# Patient Record
Sex: Female | Born: 1965 | Race: Black or African American | Hispanic: No | Smoking: Former smoker
Health system: Southern US, Community
[De-identification: ages and names within clinical notes are randomized; demographics above are authoritative.]

## PROBLEM LIST (undated history)

## (undated) DIAGNOSIS — C801 Malignant (primary) neoplasm, unspecified: Secondary | ICD-10-CM

## (undated) DIAGNOSIS — J45909 Unspecified asthma, uncomplicated: Secondary | ICD-10-CM

## (undated) DIAGNOSIS — I10 Essential (primary) hypertension: Secondary | ICD-10-CM

## (undated) DIAGNOSIS — J449 Chronic obstructive pulmonary disease, unspecified: Secondary | ICD-10-CM

## (undated) HISTORY — PX: KIDNEY SURGERY: SHX687

## (undated) HISTORY — PX: CHOLECYSTECTOMY: SHX55

## (undated) HISTORY — PX: ABDOMINAL HYSTERECTOMY: SHX81

---

## 1998-07-15 ENCOUNTER — Emergency Department (HOSPITAL_COMMUNITY): Admission: EM | Admit: 1998-07-15 | Discharge: 1998-07-15 | Payer: Self-pay | Admitting: Emergency Medicine

## 1998-10-07 ENCOUNTER — Encounter: Payer: Self-pay | Admitting: Obstetrics and Gynecology

## 1998-10-08 ENCOUNTER — Ambulatory Visit (HOSPITAL_COMMUNITY): Admission: RE | Admit: 1998-10-08 | Discharge: 1998-10-08 | Payer: Self-pay | Admitting: Obstetrics and Gynecology

## 1998-12-02 ENCOUNTER — Inpatient Hospital Stay (HOSPITAL_COMMUNITY): Admission: RE | Admit: 1998-12-02 | Discharge: 1998-12-05 | Payer: Self-pay | Admitting: Obstetrics and Gynecology

## 1999-11-22 ENCOUNTER — Encounter: Payer: Self-pay | Admitting: Emergency Medicine

## 1999-11-22 ENCOUNTER — Emergency Department (HOSPITAL_COMMUNITY): Admission: EM | Admit: 1999-11-22 | Discharge: 1999-11-22 | Payer: Self-pay | Admitting: Emergency Medicine

## 1999-11-26 ENCOUNTER — Ambulatory Visit (HOSPITAL_COMMUNITY): Admission: RE | Admit: 1999-11-26 | Discharge: 1999-11-26 | Payer: Self-pay | Admitting: *Deleted

## 2011-06-21 DIAGNOSIS — G43909 Migraine, unspecified, not intractable, without status migrainosus: Secondary | ICD-10-CM | POA: Insufficient documentation

## 2011-06-21 DIAGNOSIS — K219 Gastro-esophageal reflux disease without esophagitis: Secondary | ICD-10-CM | POA: Insufficient documentation

## 2011-06-21 DIAGNOSIS — F32A Depression, unspecified: Secondary | ICD-10-CM | POA: Insufficient documentation

## 2013-03-07 DIAGNOSIS — I10 Essential (primary) hypertension: Secondary | ICD-10-CM | POA: Insufficient documentation

## 2014-12-11 DIAGNOSIS — M25562 Pain in left knee: Secondary | ICD-10-CM | POA: Insufficient documentation

## 2016-06-02 DIAGNOSIS — Z79891 Long term (current) use of opiate analgesic: Secondary | ICD-10-CM | POA: Insufficient documentation

## 2016-06-02 DIAGNOSIS — M542 Cervicalgia: Secondary | ICD-10-CM | POA: Insufficient documentation

## 2016-07-15 DIAGNOSIS — R3129 Other microscopic hematuria: Secondary | ICD-10-CM | POA: Insufficient documentation

## 2017-02-03 DIAGNOSIS — Z905 Acquired absence of kidney: Secondary | ICD-10-CM | POA: Insufficient documentation

## 2017-03-06 DIAGNOSIS — R55 Syncope and collapse: Secondary | ICD-10-CM | POA: Insufficient documentation

## 2017-03-25 DIAGNOSIS — M1710 Unilateral primary osteoarthritis, unspecified knee: Secondary | ICD-10-CM | POA: Insufficient documentation

## 2017-03-25 DIAGNOSIS — M222X1 Patellofemoral disorders, right knee: Secondary | ICD-10-CM | POA: Insufficient documentation

## 2017-03-25 DIAGNOSIS — M222X2 Patellofemoral disorders, left knee: Secondary | ICD-10-CM | POA: Insufficient documentation

## 2017-04-29 ENCOUNTER — Emergency Department (HOSPITAL_COMMUNITY): Payer: Medicaid Other

## 2017-04-29 ENCOUNTER — Emergency Department (HOSPITAL_COMMUNITY)
Admission: EM | Admit: 2017-04-29 | Discharge: 2017-04-29 | Disposition: A | Discharge status: Home / Self Care | Payer: Medicaid Other | Attending: Emergency Medicine | Admitting: Emergency Medicine

## 2017-04-29 ENCOUNTER — Encounter (HOSPITAL_COMMUNITY): Payer: Self-pay | Admitting: Emergency Medicine

## 2017-04-29 DIAGNOSIS — F17210 Nicotine dependence, cigarettes, uncomplicated: Secondary | ICD-10-CM | POA: Insufficient documentation

## 2017-04-29 DIAGNOSIS — M545 Low back pain, unspecified: Secondary | ICD-10-CM

## 2017-04-29 DIAGNOSIS — Y9389 Activity, other specified: Secondary | ICD-10-CM | POA: Insufficient documentation

## 2017-04-29 DIAGNOSIS — J45909 Unspecified asthma, uncomplicated: Secondary | ICD-10-CM | POA: Insufficient documentation

## 2017-04-29 DIAGNOSIS — R51 Headache: Secondary | ICD-10-CM | POA: Insufficient documentation

## 2017-04-29 DIAGNOSIS — M542 Cervicalgia: Secondary | ICD-10-CM | POA: Diagnosis present

## 2017-04-29 DIAGNOSIS — Y92410 Unspecified street and highway as the place of occurrence of the external cause: Secondary | ICD-10-CM | POA: Insufficient documentation

## 2017-04-29 DIAGNOSIS — Y998 Other external cause status: Secondary | ICD-10-CM | POA: Insufficient documentation

## 2017-04-29 HISTORY — DX: Malignant (primary) neoplasm, unspecified: C80.1

## 2017-04-29 HISTORY — DX: Unspecified asthma, uncomplicated: J45.909

## 2017-04-29 MED ORDER — HYDROCODONE-ACETAMINOPHEN 5-325 MG PO TABS: ORAL_TABLET | ORAL | 0 refills | Status: DC

## 2017-04-29 MED ORDER — METHOCARBAMOL 500 MG PO TABS: 1000.0000 mg | ORAL_TABLET | Freq: Four times a day (QID) | ORAL | 0 refills | Status: DC | PRN

## 2017-04-29 NOTE — Discharge Instructions (Signed)
Take the prescriptions as directed.  Apply moist heat or ice to the area(s) of discomfort, for 15 minutes at a time, several times per day for the next few days.  Do not fall asleep on a heating or ice pack.  Call your regular medical doctor on Monday to schedule a follow up appointment next week.  Return to the Emergency Department immediately if worsening. ° °
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
## 2017-09-09 ENCOUNTER — Inpatient Hospital Stay
Admission: EM | Admit: 2017-09-09 | Discharge: 2017-09-12 | DRG: 202 | Disposition: A | Discharge status: Home / Self Care | Payer: Medicaid Other | Attending: Internal Medicine | Admitting: Internal Medicine

## 2017-09-09 DIAGNOSIS — J4541 Moderate persistent asthma with (acute) exacerbation: Principal | ICD-10-CM | POA: Diagnosis present

## 2017-09-09 DIAGNOSIS — E876 Hypokalemia: Secondary | ICD-10-CM | POA: Diagnosis present

## 2017-09-09 DIAGNOSIS — F17210 Nicotine dependence, cigarettes, uncomplicated: Secondary | ICD-10-CM | POA: Diagnosis present

## 2017-09-09 DIAGNOSIS — R0602 Shortness of breath: Secondary | ICD-10-CM

## 2017-09-09 DIAGNOSIS — I10 Essential (primary) hypertension: Secondary | ICD-10-CM | POA: Diagnosis present

## 2017-09-09 DIAGNOSIS — J4521 Mild intermittent asthma with (acute) exacerbation: Secondary | ICD-10-CM

## 2017-09-09 DIAGNOSIS — K219 Gastro-esophageal reflux disease without esophagitis: Secondary | ICD-10-CM | POA: Diagnosis present

## 2017-09-09 DIAGNOSIS — J9601 Acute respiratory failure with hypoxia: Secondary | ICD-10-CM | POA: Diagnosis present

## 2017-09-09 DIAGNOSIS — J45901 Unspecified asthma with (acute) exacerbation: Secondary | ICD-10-CM | POA: Diagnosis present

## 2017-09-09 DIAGNOSIS — Z8249 Family history of ischemic heart disease and other diseases of the circulatory system: Secondary | ICD-10-CM

## 2017-09-09 DIAGNOSIS — Z6841 Body Mass Index (BMI) 40.0 and over, adult: Secondary | ICD-10-CM

## 2017-09-09 DIAGNOSIS — J440 Chronic obstructive pulmonary disease with acute lower respiratory infection: Secondary | ICD-10-CM | POA: Diagnosis present

## 2017-09-09 DIAGNOSIS — J441 Chronic obstructive pulmonary disease with (acute) exacerbation: Secondary | ICD-10-CM | POA: Diagnosis present

## 2017-09-09 DIAGNOSIS — R0902 Hypoxemia: Secondary | ICD-10-CM

## 2017-09-09 DIAGNOSIS — J209 Acute bronchitis, unspecified: Secondary | ICD-10-CM | POA: Diagnosis present

## 2017-09-09 HISTORY — DX: Chronic obstructive pulmonary disease, unspecified: J44.9

## 2017-09-10 ENCOUNTER — Emergency Department: Payer: Medicaid Other

## 2017-09-10 ENCOUNTER — Encounter: Payer: Self-pay | Admitting: Emergency Medicine

## 2017-09-10 ENCOUNTER — Other Ambulatory Visit: Payer: Self-pay

## 2017-09-10 DIAGNOSIS — J440 Chronic obstructive pulmonary disease with acute lower respiratory infection: Secondary | ICD-10-CM | POA: Diagnosis present

## 2017-09-10 DIAGNOSIS — J4541 Moderate persistent asthma with (acute) exacerbation: Secondary | ICD-10-CM | POA: Diagnosis present

## 2017-09-10 DIAGNOSIS — J441 Chronic obstructive pulmonary disease with (acute) exacerbation: Secondary | ICD-10-CM | POA: Diagnosis present

## 2017-09-10 DIAGNOSIS — J9601 Acute respiratory failure with hypoxia: Secondary | ICD-10-CM | POA: Diagnosis present

## 2017-09-10 DIAGNOSIS — E876 Hypokalemia: Secondary | ICD-10-CM | POA: Diagnosis present

## 2017-09-10 DIAGNOSIS — F17210 Nicotine dependence, cigarettes, uncomplicated: Secondary | ICD-10-CM | POA: Diagnosis present

## 2017-09-10 DIAGNOSIS — J45901 Unspecified asthma with (acute) exacerbation: Secondary | ICD-10-CM | POA: Diagnosis present

## 2017-09-10 DIAGNOSIS — Z6841 Body Mass Index (BMI) 40.0 and over, adult: Secondary | ICD-10-CM | POA: Diagnosis not present

## 2017-09-10 DIAGNOSIS — I10 Essential (primary) hypertension: Secondary | ICD-10-CM | POA: Diagnosis present

## 2017-09-10 DIAGNOSIS — Z8249 Family history of ischemic heart disease and other diseases of the circulatory system: Secondary | ICD-10-CM | POA: Diagnosis not present

## 2017-09-10 DIAGNOSIS — K219 Gastro-esophageal reflux disease without esophagitis: Secondary | ICD-10-CM | POA: Diagnosis present

## 2017-09-10 DIAGNOSIS — R0602 Shortness of breath: Secondary | ICD-10-CM | POA: Diagnosis present

## 2017-09-10 DIAGNOSIS — J209 Acute bronchitis, unspecified: Secondary | ICD-10-CM | POA: Diagnosis present

## 2017-09-10 LAB — CBC
HEMATOCRIT: 34 % — AB (ref 35.0–47.0)
HEMATOCRIT: 35.1 % (ref 35.0–47.0)
HEMOGLOBIN: 11.5 g/dL — AB (ref 12.0–16.0)
Hemoglobin: 11.4 g/dL — ABNORMAL LOW (ref 12.0–16.0)
MCH: 31.1 pg (ref 26.0–34.0)
MCH: 31.5 pg (ref 26.0–34.0)
MCHC: 32.8 g/dL (ref 32.0–36.0)
MCHC: 33.5 g/dL (ref 32.0–36.0)
MCV: 93.9 fL (ref 80.0–100.0)
MCV: 94.7 fL (ref 80.0–100.0)
PLATELETS: 211 10*3/uL (ref 150–440)
Platelets: 219 10*3/uL (ref 150–440)
RBC: 3.62 MIL/uL — AB (ref 3.80–5.20)
RBC: 3.71 MIL/uL — AB (ref 3.80–5.20)
RDW: 13.7 % (ref 11.5–14.5)
RDW: 13.8 % (ref 11.5–14.5)
WBC: 7.8 10*3/uL (ref 3.6–11.0)
WBC: 8.7 10*3/uL (ref 3.6–11.0)

## 2017-09-10 LAB — COMPREHENSIVE METABOLIC PANEL WITH GFR
ALT: 12 U/L — ABNORMAL LOW (ref 14–54)
AST: 22 U/L (ref 15–41)
Albumin: 3.8 g/dL (ref 3.5–5.0)
Alkaline Phosphatase: 88 U/L (ref 38–126)
Anion gap: 7 (ref 5–15)
BUN: 18 mg/dL (ref 6–20)
CO2: 26 mmol/L (ref 22–32)
Calcium: 9.2 mg/dL (ref 8.9–10.3)
Chloride: 107 mmol/L (ref 101–111)
Creatinine, Ser: 1.13 mg/dL — ABNORMAL HIGH (ref 0.44–1.00)
GFR calc Af Amer: >60 mL/min
GFR calc non Af Amer: 55 mL/min — ABNORMAL LOW
Glucose, Bld: 112 mg/dL — ABNORMAL HIGH (ref 65–99)
Potassium: 3.4 mmol/L — ABNORMAL LOW (ref 3.5–5.1)
Sodium: 140 mmol/L (ref 135–145)
Total Bilirubin: 0.5 mg/dL (ref 0.3–1.2)
Total Protein: 7.2 g/dL (ref 6.5–8.1)

## 2017-09-10 LAB — BASIC METABOLIC PANEL
ANION GAP: 11 (ref 5–15)
BUN: 19 mg/dL (ref 6–20)
CO2: 21 mmol/L — ABNORMAL LOW (ref 22–32)
Calcium: 9.2 mg/dL (ref 8.9–10.3)
Chloride: 108 mmol/L (ref 101–111)
Creatinine, Ser: 1.13 mg/dL — ABNORMAL HIGH (ref 0.44–1.00)
GFR, EST NON AFRICAN AMERICAN: 55 mL/min — AB (ref >60)
Glucose, Bld: 183 mg/dL — ABNORMAL HIGH (ref 65–99)
POTASSIUM: 3 mmol/L — AB (ref 3.5–5.1)
SODIUM: 140 mmol/L (ref 135–145)

## 2017-09-10 LAB — TROPONIN I: Troponin I: <0.03 ng/mL

## 2017-09-10 MED ORDER — IPRATROPIUM-ALBUTEROL 0.5-2.5 (3) MG/3ML IN SOLN: 3.0000 mL | Freq: Four times a day (QID) | RESPIRATORY_TRACT | Status: DC

## 2017-09-10 MED ORDER — PANTOPRAZOLE SODIUM 40 MG PO TBEC
40.0000 mg | DELAYED_RELEASE_TABLET | Freq: Every day | ORAL | Status: DC
  Administered 2017-09-10 – 2017-09-12 (×3): 40 mg via ORAL
  Filled 2017-09-10 (×3): qty 1

## 2017-09-10 MED ORDER — ONDANSETRON HCL 4 MG/2ML IJ SOLN: 4.0000 mg | Freq: Four times a day (QID) | INTRAMUSCULAR | Status: DC | PRN

## 2017-09-10 MED ORDER — SODIUM CHLORIDE 0.9% FLUSH: 3.0000 mL | INTRAVENOUS | Status: DC | PRN

## 2017-09-10 MED ORDER — HYDROCOD POLST-CPM POLST ER 10-8 MG/5ML PO SUER
5.0000 mL | Freq: Once | ORAL | Status: AC
  Administered 2017-09-10: 5 mL via ORAL
  Filled 2017-09-10: qty 5

## 2017-09-10 MED ORDER — ONDANSETRON HCL 4 MG PO TABS: 4.0000 mg | ORAL_TABLET | Freq: Four times a day (QID) | ORAL | Status: DC | PRN

## 2017-09-10 MED ORDER — LORATADINE 10 MG PO TABS
10.0000 mg | ORAL_TABLET | Freq: Every day | ORAL | Status: DC
Start: 2017-09-10 — End: 2017-09-12
  Administered 2017-09-10 – 2017-09-12 (×3): 10 mg via ORAL
  Filled 2017-09-10 (×3): qty 1

## 2017-09-10 MED ORDER — SENNOSIDES-DOCUSATE SODIUM 8.6-50 MG PO TABS: 1.0000 | ORAL_TABLET | Freq: Every evening | ORAL | Status: DC | PRN

## 2017-09-10 MED ORDER — HYDROCODONE-ACETAMINOPHEN 5-325 MG PO TABS
1.0000 | ORAL_TABLET | Freq: Four times a day (QID) | ORAL | Status: DC | PRN
  Administered 2017-09-10 – 2017-09-11 (×4): 1 via ORAL
  Filled 2017-09-10 (×4): qty 1

## 2017-09-10 MED ORDER — ACETAMINOPHEN 650 MG RE SUPP: 650.0000 mg | Freq: Four times a day (QID) | RECTAL | Status: DC | PRN

## 2017-09-10 MED ORDER — HYDROCHLOROTHIAZIDE 25 MG PO TABS
25.0000 mg | ORAL_TABLET | Freq: Every day | ORAL | Status: DC
  Filled 2017-09-10 (×2): qty 1

## 2017-09-10 MED ORDER — IPRATROPIUM-ALBUTEROL 0.5-2.5 (3) MG/3ML IN SOLN
3.0000 mL | Freq: Four times a day (QID) | RESPIRATORY_TRACT | Status: DC
  Administered 2017-09-10 – 2017-09-11 (×7): 3 mL via RESPIRATORY_TRACT
  Filled 2017-09-10 (×7): qty 3

## 2017-09-10 MED ORDER — FAMOTIDINE 20 MG PO TABS
20.0000 mg | ORAL_TABLET | Freq: Two times a day (BID) | ORAL | Status: DC
  Administered 2017-09-10 – 2017-09-12 (×5): 20 mg via ORAL
  Filled 2017-09-10 (×5): qty 1

## 2017-09-10 MED ORDER — ALBUTEROL SULFATE (2.5 MG/3ML) 0.083% IN NEBU
2.5000 mg | INHALATION_SOLUTION | RESPIRATORY_TRACT | Status: DC | PRN
  Administered 2017-09-10: 2.5 mg via RESPIRATORY_TRACT
  Filled 2017-09-10: qty 3

## 2017-09-10 MED ORDER — METHYLPREDNISOLONE SODIUM SUCC 125 MG IJ SOLR
125.0000 mg | Freq: Four times a day (QID) | INTRAMUSCULAR | Status: DC
  Administered 2017-09-10: 125 mg via INTRAVENOUS
  Filled 2017-09-10: qty 2

## 2017-09-10 MED ORDER — IPRATROPIUM-ALBUTEROL 0.5-2.5 (3) MG/3ML IN SOLN
3.0000 mL | Freq: Once | RESPIRATORY_TRACT | Status: AC
  Administered 2017-09-10: 3 mL via RESPIRATORY_TRACT
  Filled 2017-09-10: qty 3

## 2017-09-10 MED ORDER — ACETAMINOPHEN 325 MG PO TABS: 650.0000 mg | ORAL_TABLET | Freq: Four times a day (QID) | ORAL | Status: DC | PRN

## 2017-09-10 MED ORDER — ENOXAPARIN SODIUM 40 MG/0.4ML ~~LOC~~ SOLN
40.0000 mg | SUBCUTANEOUS | Status: DC
  Administered 2017-09-10 – 2017-09-12 (×3): 40 mg via SUBCUTANEOUS
  Filled 2017-09-10 (×3): qty 0.4

## 2017-09-10 MED ORDER — METHYLPREDNISOLONE SODIUM SUCC 125 MG IJ SOLR: 125.0000 mg | Freq: Four times a day (QID) | INTRAMUSCULAR | Status: DC

## 2017-09-10 MED ORDER — SODIUM CHLORIDE 0.9% FLUSH
3.0000 mL | Freq: Two times a day (BID) | INTRAVENOUS | Status: DC
  Administered 2017-09-10 – 2017-09-12 (×6): 3 mL via INTRAVENOUS

## 2017-09-10 MED ORDER — AMLODIPINE BESYLATE 5 MG PO TABS
10.0000 mg | ORAL_TABLET | Freq: Every day | ORAL | Status: DC
  Administered 2017-09-10 – 2017-09-12 (×3): 10 mg via ORAL
  Filled 2017-09-10 (×3): qty 2

## 2017-09-10 MED ORDER — SODIUM CHLORIDE 0.9 % IV SOLN: 250.0000 mL | INTRAVENOUS | Status: DC | PRN

## 2017-09-10 MED ORDER — METHYLPREDNISOLONE SODIUM SUCC 40 MG IJ SOLR
40.0000 mg | Freq: Three times a day (TID) | INTRAMUSCULAR | Status: DC
  Administered 2017-09-10 – 2017-09-12 (×6): 40 mg via INTRAVENOUS
  Filled 2017-09-10 (×6): qty 1

## 2017-09-10 MED ORDER — POTASSIUM CHLORIDE CRYS ER 20 MEQ PO TBCR
20.0000 meq | EXTENDED_RELEASE_TABLET | Freq: Once | ORAL | Status: AC
  Administered 2017-09-10: 20 meq via ORAL
  Filled 2017-09-10: qty 1

## 2017-09-10 NOTE — Progress Notes (Signed)
[place] [facility] [place] NAME: Wendy Ramos    MR#:  [id number]  DATE OF BIRTH:  [birth year]  SUBJECTIVE:   Sob wheezing Lives in a house with mold  REVIEW OF SYSTEMS:    Review of Systems  Constitutional: Negative for fever, chills weight loss HENT: Negative for ear pain, nosebleeds, congestion, facial swelling, rhinorrhea, neck pain, neck stiffness and ear discharge.   Respiratory: Positive for cough, shortness of breath and wheezing.  Cardiovascular:Negative for chest pain, palpitations and leg swelling.  Gastrointestinal: Negative for heartburn, abdominal pain, vomiting,  diarrhea or consitpation Genitourinary: Negative for dysuria, urgency, frequency, hematuria Musculoskeletal: Negative for back pain or joint pain Neurological: Negative for dizziness, seizures, syncope, focal weakness,  numbness and headaches.  Hematological: Does not bruise/bleed easily.  Psychiatric/Behavioral: Negative for hallucinations, confusion, dysphoric mood    Tolerating Diet: yes      DRUG ALLERGIES:   Allergies  Allergen Reactions  . Chicken Allergy Rash  . Chocolate Flavor Hives  . Fish-Derived Products Hives    Allergic to all seafood.  . Metronidazole Hives  . Cefazolin Hives    Developed local urticaria after 1.2gm ancef administration intraoperatively. Medication discontinued, no additional intervention required.  . Iopamidol Hives and Itching    Delayed reaction approx. 20 minutes post contrast.  . Beef-Derived Products Hives  . Fish Allergy Hives  . Galactose Rash    POSSIBLE ALPHA GAL ALLERGY - rash reaction to all beef and chicken    VITALS:  Blood pressure 131/69, pulse 94, temperature 98.1 F (36.7 C), temperature source Oral, resp. rate 20, height 5\' 4"  (1.626 m), weight 108.9 kg (240 lb), SpO2 96 %.  PHYSICAL EXAMINATION:  Constitutional: Appears well-developed and well-nourished. No distress. HENT: Normocephalic. Marland Kitchen Oropharynx  is clear and moist.  Eyes: Conjunctivae and EOM are normal. PERRLA, no scleral icterus.  Neck: Normal ROM. Neck supple. No JVD. No tracheal deviation. CVS: RRR, S1/S2 +, no murmurs, no gallops, no carotid bruit.  Pulmonary: Bilateral prolonged expiratory wheezing without rales  Abdominal: Soft. BS +,  no distension, tenderness, rebound or guarding.  Musculoskeletal: Normal range of motion. No edema and no tenderness.  Neuro: Alert. CN 2-12 grossly intact. No focal deficits. Skin: Skin is warm and dry. No rash noted. Psychiatric: Normal mood and affect.      LABORATORY PANEL:   CBC Recent Labs  Lab 09/10/17 0505  WBC 8.7  HGB 11.4*  HCT 34.0*  PLT 211   ------------------------------------------------------------------------------------------------------------------  Chemistries  Recent Labs  Lab 09/10/17 0004 09/10/17 0505  NA 140 140  K 3.4* 3.0*  CL 107 108  CO2 26 21*  GLUCOSE 112* 183*  BUN 18 19  CREATININE 1.13* 1.13*  CALCIUM 9.2 9.2  AST 22  --   ALT 12*  --   ALKPHOS 88  --   BILITOT 0.5  --    ------------------------------------------------------------------------------------------------------------------  Cardiac Enzymes Recent Labs  Lab 09/10/17 0004  TROPONINI <0.03   ------------------------------------------------------------------------------------------------------------------  RADIOLOGY:  Dg Chest 1 View  Result Date: 09/10/2017 CLINICAL DATA:  Shortness of breath. EXAM: CHEST 1 VIEW COMPARISON:  None. FINDINGS: Borderline mild cardiomegaly. Normal mediastinal contours. No pulmonary edema. No consolidation, pleural fluid or pneumothorax. No acute osseous abnormality. IMPRESSION: Borderline mild cardiomegaly.  No acute pulmonary process. Electronically Signed   By: Jeb Levering M.D.   On: 09/10/2017 00:35     ASSESSMENT AND PLAN:   51 year old female with history of well-controlled asthma and tobacco  dependence who presents with  shortness of breath.  1. Acute hypoxic respiratory failure in the setting of mild intermittent asthma exacerbation Wean oxygen to room air is tolerated   2. Acute asthma exacerbation: I will decrease steroids to 60 IV every 8 hours. Continue inhalers and nebulizer treatments Patient will need long-acting inhaler upon discharge  3.Tobacco dependence: Patient is encouraged to quit smoking. Counseling was provided for 4 minutes.  4. Hypokalemia: Replete and recheck in a.m.  5. Essential hypertension: Continue Norvasc and HCTZ      Management plans discussed with the patient and she is in agreement.  CODE STATUS: full  TOTAL TIME TAKING CARE OF THIS PATIENT: 30 minutes.     POSSIBLE D/C 1-2 days, DEPENDING ON CLINICAL CONDITION.   [name] M.D on 09/10/2017 at 8:53 AM  Between 7am to 6pm - Pager - [phone] After 6pm go to [email or web address] - password [email or web address] [facility]  Office  [phone]  CC: Primary care physician; System, Pcp Not In  Note: This dictation was prepared with Dragon dictation along with smaller phrase technology. Any transcriptional errors that result from this process are unintentional.

## 2017-09-10 NOTE — ED Triage Notes (Signed)
Pt arrives via ems with shob. Pt with history of asthma. Pox of 89%on ra. Ems gave duoneb x2 and solumedrol 125mg  iv. Pt with wheezing noted in all lung fields.

## 2017-09-10 NOTE — ED Provider Notes (Signed)
[facility] Emergency Department Provider Note   ____________________________________________   First MD Initiated Contact with Patient 09/10/17 0006     (approximate)  I have reviewed the triage vital signs and the nursing notes.   HISTORY  Chief Complaint Shortness of Breath    HPI Wendy Ramos is a 51 y.o. female brought to the ED from home via EMS with a chief complaint of shortness of breath.  Patient has a history of asthma, never intubated, last hospitalization approximately 1 year ago who presents with progressive shortness of breath and dry cough over the past 2 days.  Does not wear oxygen at home.  Room air saturations 89% on EMS arrival.  She was given 2 DuoNeb's, 125 mg IV Solu-Medrol in route to the ED with improvement in symptoms.  Denies associated fever, chills, chest pain, abdominal pain, nausea, vomiting.  Denies recent travel or trauma.   Past Medical History:  Diagnosis Date  . Asthma   . Cancer ([place])     There are no active problems to display for this patient.   Past Surgical History:  Procedure Laterality Date  . ABDOMINAL HYSTERECTOMY    . CHOLECYSTECTOMY    . KIDNEY SURGERY      Prior to Admission medications   Medication Sig Start Date End Date Taking? Authorizing Provider  amLODipine (NORVASC) 10 MG tablet Take 10 mg by mouth daily. 02/24/17   [provider]  cetirizine (ZYRTEC) 10 MG tablet Take 10 mg by mouth daily as needed. 01/31/17   [provider]  hydrochlorothiazide (HYDRODIURIL) 25 MG tablet Take 25 mg by mouth daily. 02/24/17   [provider]  HYDROcodone-acetaminophen (NORCO/VICODIN) 5-325 MG tablet 1 or 2 tabs PO q6 hours prn pain 04/29/17   Francine Graven, DO  methocarbamol (ROBAXIN) 500 MG tablet Take 2 tablets (1,000 mg total) by mouth 4 (four) times daily as needed for muscle spasms (muscle spasm/pain). 04/29/17   Francine Graven, DO  potassium chloride (K-DUR) 10 MEQ tablet  10 mEq 2 (two) times daily. 03/08/17   [provider]  UNKNOWN TO PATIENT Take 1 tablet by mouth 2 (two) times daily. 3 day course starting 04/29/2017 for UTI    [provider]    Allergies Beef-derived products and Fish allergy  History reviewed. No pertinent family history.  Social History Social History   Tobacco Use  . Smoking status: Current Every Day Smoker    Packs/day: 0.50    Types: Cigarettes  . Smokeless tobacco: Never Used  Substance Use Topics  . Alcohol use: No  . Drug use: No    Review of Systems  Constitutional: No fever/chills. Eyes: No visual changes. ENT: No sore throat. Cardiovascular: Denies chest pain. Respiratory: Positive for dry cough, wheezing and shortness of breath. Gastrointestinal: No abdominal pain.  No nausea, no vomiting.  No diarrhea.  No constipation. Genitourinary: Negative for dysuria. Musculoskeletal: Negative for back pain. Skin: Negative for rash. Neurological: Negative for headaches, focal weakness or numbness.   ____________________________________________   PHYSICAL EXAM:  VITAL SIGNS: ED Triage Vitals  Enc Vitals Group     BP 09/10/17 0001 99/86     Pulse Rate 09/10/17 0001 97     Resp 09/10/17 0001 (!) 28     Temp 09/10/17 0001 98.7 F (37.1 C)     Temp Source 09/10/17 0001 Oral     SpO2 09/10/17 0001 92 %     Weight 09/10/17 0002 240 lb (108.9 kg)  Height 09/10/17 0002 5\' 4"  (1.626 m)     Head Circumference --      Peak Flow --      Pain Score 09/10/17 0001 7     Pain Loc --      Pain Edu? --      Excl. in [place]? --     Constitutional: Alert and oriented. Well appearing and in moderate acute distress. Eyes: Conjunctivae are normal. PERRL. EOMI. Head: Atraumatic. Nose: No congestion/rhinnorhea. Mouth/Throat: Mucous membranes are moist.  Oropharynx non-erythematous. Neck: No stridor.   Cardiovascular: Normal rate, regular rhythm. Grossly normal heart sounds.  Good peripheral  circulation. Respiratory: Increased respiratory effort.  No retractions. Lungs with scattered wheezing. Gastrointestinal: Soft and nontender. No distention. No abdominal bruits. No CVA tenderness. Musculoskeletal: No lower extremity tenderness nor edema.  No joint effusions. Neurologic:  Normal speech and language. No gross focal neurologic deficits are appreciated.  Skin:  Skin is warm, dry and intact. No rash noted. Psychiatric: Mood and affect are normal. Speech and behavior are normal.  ____________________________________________   LABS (all labs ordered are listed, but only abnormal results are displayed)  Labs Reviewed  CBC - Abnormal; Notable for the following components:      Result Value   RBC 3.71 (*)    Hemoglobin 11.5 (*)    All other components within normal limits  COMPREHENSIVE METABOLIC PANEL - Abnormal; Notable for the following components:   Potassium 3.4 (*)    Glucose, Bld 112 (*)    Creatinine, Ser 1.13 (*)    ALT 12 (*)    GFR calc non Af Amer 55 (*)    All other components within normal limits  TROPONIN I   ____________________________________________  EKG  ED ECG REPORT I, Braydyn Schultes J, the attending physician, personally viewed and interpreted this ECG.   Date: 09/10/2017  EKG Time: 0003  Rate: 93  Rhythm: normal EKG, normal sinus rhythm  Axis: Normal  Intervals:none  ST&T Change: Nonspecific  ____________________________________________  RADIOLOGY  Dg Chest 1 View  Result Date: 09/10/2017 CLINICAL DATA:  Shortness of breath. EXAM: CHEST 1 VIEW COMPARISON:  None. FINDINGS: Borderline mild cardiomegaly. Normal mediastinal contours. No pulmonary edema. No consolidation, pleural fluid or pneumothorax. No acute osseous abnormality. IMPRESSION: Borderline mild cardiomegaly.  No acute pulmonary process. Electronically Signed   By: Jeb Levering M.D.   On: 09/10/2017 00:35     ____________________________________________   PROCEDURES  Procedure(s) performed: None  Procedures  Critical Care performed: No  ____________________________________________   INITIAL IMPRESSION / ASSESSMENT AND PLAN / ED COURSE  As part of my medical decision making, I reviewed the following data within the [facility] notes reviewed and incorporated, Labs reviewed, EKG interpreted, Radiograph reviewed , Discussed with admitting physician and Notes from prior ED visits.   51 year old female with asthma who presents with exacerbation. Differential includes, but is not limited to, viral syndrome, bronchitis including COPD exacerbation, pneumonia, reactive airway disease including asthma, CHF including exacerbation with or without pulmonary/interstitial edema, pneumothorax, ACS, thoracic trauma, and pulmonary embolism.  Patient remains mildly wheezing after 2 DuoNeb; will administer third DuoNeb.  She has already received Solu-Medrol per EMS.  Will reassess.  Clinical Course as of Sep 10 154  Sat Sep 10, 2017  0154 Mildly tachypneic with scattered wheezing.  Will administer another DuoNeb, Tussionex and discussed with hospitalist to evaluate patient in the emergency department for admission.  [JS]    Clinical Course User Index [JS] Lurline Hare  J, MD     ____________________________________________   FINAL CLINICAL IMPRESSION(S) / ED DIAGNOSES  Final diagnoses:  Moderate persistent asthma with exacerbation  Shortness of breath  Hypoxia     ED Discharge Orders    None       Note:  This document was prepared using Dragon voice recognition software and may include unintentional dictation errors.    Paulette Blanch, MD 09/10/17 [phone]

## 2017-09-10 NOTE — ED Notes (Signed)
Attempt to call report without success, per floor awaiting another room assignment.

## 2017-09-10 NOTE — H&P (Signed)
[facility] [place] NAME: Wendy Ramos    MR#:  [id number]  DATE OF BIRTH:  [birth year]  DATE OF ADMISSION:  09/09/2017  PRIMARY CARE PHYSICIAN: System, Pcp Not In   REQUESTING/REFERRING PHYSICIAN:   CHIEF COMPLAINT:   Chief Complaint  Patient presents with  . Shortness of Breath    HISTORY OF PRESENT ILLNESS: Wendy Ramos  is a 51 y.o. female with a known history of bronchial asthma presented to the emergency room with increased shortness of breath the last few days. Patient is wheezing in both the lungs and felt short of breath and was in respiratory distress when she came to the ER. Patient does not use any home oxygen and has never been intubated in the past. She was put on oxygen via nasal cannula and given breathing treatments aggressively in the emergency room. Her oxygen saturations were low. Patient received IV steroids and breathing treatments. She still was wheezing and tachypneic and hospitalist service was consulted. No complaints of any chest pain. No fever or chills. Has dry cough. No history of any recent travel, sick contacts at home.  PAST MEDICAL HISTORY:   Past Medical History:  Diagnosis Date  . Asthma   . Cancer [facility])     PAST SURGICAL HISTORY:  Past Surgical History:  Procedure Laterality Date  . ABDOMINAL HYSTERECTOMY    . CHOLECYSTECTOMY    . KIDNEY SURGERY      SOCIAL HISTORY:  Social History   Tobacco Use  . Smoking status: Current Every Day Smoker    Packs/day: 0.50    Types: Cigarettes  . Smokeless tobacco: Never Used  Substance Use Topics  . Alcohol use: No    FAMILY HISTORY:  Family History  Problem Relation Age of Onset  . Hypertension Mother   . Heart disease Mother     DRUG ALLERGIES:  Allergies  Allergen Reactions  . Chicken Allergy Rash  . Chocolate Flavor Hives  . Fish-Derived Products Hives    Allergic to all seafood.  . Metronidazole Hives  . Cefazolin Hives     Developed local urticaria after 1.2gm ancef administration intraoperatively. Medication discontinued, no additional intervention required.  . Iopamidol Hives and Itching    Delayed reaction approx. 20 minutes post contrast.  . Beef-Derived Products Hives  . Fish Allergy Hives  . Galactose Rash    POSSIBLE ALPHA GAL ALLERGY - rash reaction to all beef and chicken    REVIEW OF SYSTEMS:   CONSTITUTIONAL: No fever, fatigue or weakness.  EYES: No blurred or double vision.  EARS, NOSE, AND THROAT: No tinnitus or ear pain.  RESPIRATORY: Has dry cough, shortness of breath, wheezing  No hemoptysis.  CARDIOVASCULAR: No chest pain, orthopnea, edema.  GASTROINTESTINAL: No nausea, vomiting, diarrhea or abdominal pain.  GENITOURINARY: No dysuria, hematuria.  ENDOCRINE: No polyuria, nocturia,  HEMATOLOGY: No anemia, easy bruising or bleeding SKIN: No rash or lesion. MUSCULOSKELETAL: No joint pain or arthritis.   NEUROLOGIC: No tingling, numbness, weakness.  PSYCHIATRY: No anxiety or depression.   MEDICATIONS AT HOME:  Prior to Admission medications   Medication Sig Start Date End Date Taking? Authorizing Provider  amLODipine (NORVASC) 10 MG tablet Take 10 mg by mouth daily. 02/24/17   [provider]  cetirizine (ZYRTEC) 10 MG tablet Take 10 mg by mouth daily as needed. 01/31/17   [provider]  hydrochlorothiazide (HYDRODIURIL) 25 MG tablet Take 25 mg by mouth daily. 02/24/17   [provider]  HYDROcodone-acetaminophen (NORCO/VICODIN) 5-325 MG tablet 1 or 2 tabs PO q6 hours prn pain 04/29/17   Francine Graven, DO  methocarbamol (ROBAXIN) 500 MG tablet Take 2 tablets (1,000 mg total) by mouth 4 (four) times daily as needed for muscle spasms (muscle spasm/pain). 04/29/17   Francine Graven, DO  potassium chloride (K-DUR) 10 MEQ tablet 10 mEq 2 (two) times daily. 03/08/17   [provider]  UNKNOWN TO PATIENT Take 1 tablet by mouth 2 (two) times daily. 3 day  course starting 04/29/2017 for UTI    [provider]      PHYSICAL EXAMINATION:   VITAL SIGNS: Blood pressure 97/82, pulse 80, temperature 98.7 F (37.1 C), temperature source Oral, resp. rate (!) 21, height 5\' 4"  (1.626 m), weight 108.9 kg (240 lb), SpO2 98 %.  GENERAL:  51 y.o.-year-old patient lying in the bed with no acute distress.  EYES: Pupils equal, round, reactive to light and accommodation. No scleral icterus. Extraocular muscles intact.  HEENT: Head atraumatic, normocephalic. Oropharynx and nasopharynx clear.  NECK:  Supple, no jugular venous distention. No thyroid enlargement, no tenderness.  LUNGS: Decreased breath sounds bilaterally, bilateral wheezing noted. No use of accessory muscles of respiration.  CARDIOVASCULAR: S1, S2 normal. No murmurs, rubs, or gallops.  ABDOMEN: Soft, nontender, nondistended. Bowel sounds present. No organomegaly or mass.  EXTREMITIES: No pedal edema, cyanosis, or clubbing.  NEUROLOGIC: Cranial nerves II through XII are intact. Muscle strength 5/5 in all extremities. Sensation intact. Gait not checked.  PSYCHIATRIC: The patient is alert and oriented x 3.  SKIN: No obvious rash, lesion, or ulcer.   LABORATORY PANEL:   CBC Recent Labs  Lab 09/10/17 0004  WBC 7.8  HGB 11.5*  HCT 35.1  PLT 219  MCV 94.7  MCH 31.1  MCHC 32.8  RDW 13.8   ------------------------------------------------------------------------------------------------------------------  Chemistries  Recent Labs  Lab 09/10/17 0004  NA 140  K 3.4*  CL 107  CO2 26  GLUCOSE 112*  BUN 18  CREATININE 1.13*  CALCIUM 9.2  AST 22  ALT 12*  ALKPHOS 88  BILITOT 0.5   ------------------------------------------------------------------------------------------------------------------ estimated creatinine clearance is 70.2 mL/min (A) (by C-G formula based on SCr of 1.13 mg/dL  (H)). ------------------------------------------------------------------------------------------------------------------ No results for input(s): TSH, T4TOTAL, T3FREE, THYROIDAB in the last 72 hours.  Invalid input(s): FREET3   Coagulation profile No results for input(s): INR, PROTIME in the last 168 hours. ------------------------------------------------------------------------------------------------------------------- No results for input(s): DDIMER in the last 72 hours. -------------------------------------------------------------------------------------------------------------------  Cardiac Enzymes Recent Labs  Lab 09/10/17 0004  TROPONINI <0.03   ------------------------------------------------------------------------------------------------------------------ Invalid input(s): POCBNP  ---------------------------------------------------------------------------------------------------------------  Urinalysis No results found for: COLORURINE, APPEARANCEUR, LABSPEC, PHURINE, GLUCOSEU, HGBUR, BILIRUBINUR, KETONESUR, PROTEINUR, UROBILINOGEN, NITRITE, LEUKOCYTESUR   RADIOLOGY: Dg Chest 1 View  Result Date: 09/10/2017 CLINICAL DATA:  Shortness of breath. EXAM: CHEST 1 VIEW COMPARISON:  None. FINDINGS: Borderline mild cardiomegaly. Normal mediastinal contours. No pulmonary edema. No consolidation, pleural fluid or pneumothorax. No acute osseous abnormality. IMPRESSION: Borderline mild cardiomegaly.  No acute pulmonary process. Electronically Signed   By: Jeb Levering M.D.   On: 09/10/2017 00:35    EKG: Orders placed or performed during the hospital encounter of 09/09/17  . ED EKG within 10 minutes  . ED EKG within 10 minutes  . EKG 12-Lead  . EKG 12-Lead  . EKG 12-Lead  . EKG 12-Lead    IMPRESSION AND PLAN: 51 year old female patient with history of bronchial presented to the emergency room with shortness of breath and  wheezing.  Admitting diagnosis 1. Acute  bronchial asthma exacerbation 2. Hypoxia 3. Hypokalemia Treatment plan Admit patient to medical floor IV Solu-Medrol 125 MG every 6 hourly Nebulization treatments aggressively Replace potassium Oxygen via nasal cannula   All the records are reviewed and case discussed with ED provider. Management plans discussed with the patient, family and they are in agreement.  CODE STATUS:FULL CODE Code Status History    This patient does not have a recorded code status. Please follow your organizational policy for patients in this situation.       TOTAL TIME TAKING CARE OF THIS PATIENT: 50 minutes.    Saundra Shelling M.D on 09/10/2017 at 2:31 AM  Between 7am to 6pm - Pager - [phone]  After 6pm go to [email or web address] - password [email or web address] [facility]  Office  [phone]  CC: Primary care physician; System, Pcp Not In

## 2017-09-10 NOTE — Plan of Care (Signed)
  Progressing Education: Knowledge of General Education information will improve 09/10/2017 0504 - Progressing by [name], Lucille Passy, RN Health Behavior/Discharge Planning: Ability to manage health-related needs will improve 09/10/2017 0504 - Progressing by [name], Lucille Passy, RN Clinical Measurements: Ability to maintain clinical measurements within normal limits will improve 09/10/2017 0504 - Progressing by [name], Lucille Passy, RN Will remain free from infection 09/10/2017 0504 - Progressing by [name], Lucille Passy, RN Diagnostic test results will improve 09/10/2017 0504 - Progressing by [name], Lucille Passy, RN Respiratory complications will improve 09/10/2017 0504 - Progressing by [name], RN Cardiovascular complication will be avoided 09/10/2017 0504 - Progressing by [name], RN Activity: Risk for activity intolerance will decrease 09/10/2017 0504 - Progressing by [name], RN Nutrition: Adequate nutrition will be maintained 09/10/2017 0504 - Progressing by [name], RN Coping: Level of anxiety will decrease 09/10/2017 0504 - Progressing by [name], RN Elimination: Will not experience complications related to bowel motility 09/10/2017 0504 - Progressing by [name], RN Will not experience complications related to urinary retention 09/10/2017 0504 - Progressing by [name], Lucille Passy, RN Pain Managment: General experience of comfort will improve 09/10/2017 0504 - Progressing by [name], Lucille Passy, RN Safety: Ability to remain free from injury will improve 09/10/2017 0504 - Progressing by [name], Lucille Passy, RN Skin Integrity: Risk for impaired skin integrity will decrease 09/10/2017 0504 - Progressing by [name], Lucille Passy, RN

## 2017-09-10 NOTE — ED Notes (Signed)
Pt reports improved shob after 3rd duoneb. Pt is able to speak in full sentences, oxygen at 4lpm continues to infuse via [place].

## 2017-09-10 NOTE — ED Notes (Signed)
Pt sleeping. 

## 2017-09-10 NOTE — ED Notes (Signed)
April RN, aware of bed assigned   

## 2017-09-10 NOTE — ED Notes (Signed)
Pt updated on admission process. Pt verbalizes understanding.  

## 2017-09-11 ENCOUNTER — Other Ambulatory Visit: Payer: Self-pay

## 2017-09-11 LAB — BASIC METABOLIC PANEL
ANION GAP: 5 (ref 5–15)
BUN: 25 mg/dL — ABNORMAL HIGH (ref 6–20)
CALCIUM: 9.2 mg/dL (ref 8.9–10.3)
CHLORIDE: 107 mmol/L (ref 101–111)
CO2: 25 mmol/L (ref 22–32)
Creatinine, Ser: 1.12 mg/dL — ABNORMAL HIGH (ref 0.44–1.00)
GFR calc non Af Amer: 55 mL/min — ABNORMAL LOW (ref >60)
GLUCOSE: 196 mg/dL — AB (ref 65–99)
Potassium: 4.1 mmol/L (ref 3.5–5.1)
Sodium: 137 mmol/L (ref 135–145)

## 2017-09-11 MED ORDER — BENZONATATE 100 MG PO CAPS
200.0000 mg | ORAL_CAPSULE | Freq: Three times a day (TID) | ORAL | Status: DC
  Administered 2017-09-11 – 2017-09-12 (×4): 200 mg via ORAL
  Filled 2017-09-11 (×4): qty 2

## 2017-09-11 NOTE — Evaluation (Signed)
Physical Therapy Evaluation Patient Details Name: Wendy Ramos MRN: [id number] DOB: 1966/10/14 Today's Date: 09/11/2017   History of Present Illness  Patient is a 51 y/o female that presents with COPD exacerbation and shortness of breath.   Clinical Impression  Patient is evaluated after COPD exacerbation. She has been independent at home, but admits to multiple falls secondary to LE pain and developing low back pain. She had previously been going to outpatient PT services prior to moving to [facility] and is interested in continuing that to manage knee pain. She maintains appropriate O2 sats throughout this session on room air. She does not demonstrate any acute mobility deficits, and appears to be at her mobility baseline, though she does have mild dyspnea on exertion. No acute recommendations at this time, though would suggest setting up outpatient services to help manage her bilateral knee pain.     Follow Up Recommendations Outpatient PT    Equipment Recommendations       Recommendations for Other Services       Precautions / Restrictions Restrictions Weight Bearing Restrictions: No      Mobility  Bed Mobility Overal bed mobility: Independent             General bed mobility comments: No deficitis identified in bed mobility.   Transfers Overall transfer level: Independent               General transfer comment: No deficits observed in sit to stand transfer.   Ambulation/Gait Ambulation/Gait assistance: Independent Ambulation Distance (Feet): 360 Feet Assistive device: None Gait Pattern/deviations: WFL(Within Functional Limits)   Gait velocity interpretation: at or above normal speed for age/gender General Gait Details: Patient demonstrates appropriate gait speed and no lateral sway. Her O2 sats remained > 95% on RA, though her HR did transiently increase with ambulation to 118 bpm.   Stairs            Wheelchair Mobility    Modified Rankin (Stroke  Patients Only)       Balance Overall balance assessment: Independent;History of Falls                                           Pertinent Vitals/Pain Pain Assessment: Faces Faces Pain Scale: Hurts little more Pain Location: Bilateral knees  Pain Descriptors / Indicators: Aching Pain Intervention(s): Limited activity within patient's tolerance;Monitored during session    [place] expects to be discharged to:: Private residence Living Arrangements: Spouse/significant other Available Help at Discharge: Available PRN/intermittently Type of Home: House Home Access: Stairs to enter Entrance Stairs-Rails: Can reach both Entrance Stairs-Number of Steps: 2 Home Layout: One level Home Equipment: None      Prior Function Level of Independence: Independent         Comments: Patient had been ambulating independently, though she has considered using a SPC for knee pain. Reports she has had several falls due to knee pain.      Hand Dominance        Extremity/Trunk Assessment   Upper Extremity Assessment Upper Extremity Assessment: Overall WFL for tasks assessed    Lower Extremity Assessment Lower Extremity Assessment: Overall WFL for tasks assessed       Communication   Communication: No difficulties  Cognition Arousal/Alertness: Awake/alert Behavior During Therapy: WFL for tasks assessed/performed Overall Cognitive Status: Within Functional Limits for tasks assessed  General Comments      Exercises     Assessment/Plan    PT Assessment Patient needs continued PT services  PT Problem List Pain       PT Treatment Interventions Therapeutic exercise    PT Goals (Current goals can be found in the Care Plan section)  Acute Rehab PT Goals Patient Stated Goal: To have less knee pain and breathe better  PT Goal Formulation: With patient Time For Goal Achievement:  09/25/17 Potential to Achieve Goals: Good    Frequency Other (Comment)(No acute PT needs, more appropriate for outpatient therapy)   Barriers to discharge        Co-evaluation               AM-PAC PT "6 Clicks" Daily Activity  Outcome Measure Difficulty turning over in bed (including adjusting bedclothes, sheets and blankets)?: None Difficulty moving from lying on back to sitting on the side of the bed? : None Difficulty sitting down on and standing up from a chair with arms (e.g., wheelchair, bedside commode, etc,.)?: None Help needed moving to and from a bed to chair (including a wheelchair)?: None Help needed walking in hospital room?: None Help needed climbing 3-5 steps with a railing? : None 6 Click Score: 24    End of Session Equipment Utilized During Treatment: Gait belt Activity Tolerance: Patient tolerated treatment well Patient left: in bed;with call bell/phone within reach Nurse Communication: Mobility status(Will d/c from PT, no alarm on given her performance) PT Visit Diagnosis: Difficulty in walking, not elsewhere classified (R26.2)    Time: 3716-9678 PT Time Calculation (min) (ACUTE ONLY): 12 min   Charges:   PT Evaluation $PT Eval Low Complexity: 1 Low     PT G Codes:   PT G-Codes **NOT FOR INPATIENT CLASS** Functional Assessment Tool Used: AM-PAC 6 Clicks Basic Mobility Functional Limitation: Mobility: Walking and moving around Mobility: Walking and Moving Around Current Status (L3810): 0 percent impaired, limited or restricted Mobility: Walking and Moving Around Goal Status (F7510): 0 percent impaired, limited or restricted Mobility: Walking and Moving Around Discharge Status (C5852): 0 percent impaired, limited or restricted   Royce Macadamia PT, DPT, CSCS    09/11/2017, 5:02 PM

## 2017-09-11 NOTE — Progress Notes (Signed)
[facility] at [place] NAME: Wendy Ramos    MR#:  [id number]  DATE OF BIRTH:  Aug 23, 1966  SUBJECTIVE:   Patient still with shortness of breath and wheezing. REVIEW OF SYSTEMS:    Review of Systems  Constitutional: Negative for fever, chills weight loss HENT: Negative for ear pain, nosebleeds, congestion, facial swelling, rhinorrhea, neck pain, neck stiffness and ear discharge.   Respiratory: Positive for cough, shortness of breath and wheezing.  Cardiovascular:Negative for chest pain, palpitations and leg swelling.  Gastrointestinal: Negative for heartburn, abdominal pain, vomiting,  diarrhea or consitpation Genitourinary: Negative for dysuria, urgency, frequency, hematuria Musculoskeletal: Negative for back pain or joint pain Neurological: Negative for dizziness, seizures, syncope, focal weakness,  numbness and headaches.  Hematological: Does not bruise/bleed easily.  Psychiatric/Behavioral: Negative for hallucinations, confusion, dysphoric mood    Tolerating Diet: yes      DRUG ALLERGIES:   Allergies  Allergen Reactions  . Chicken Allergy Rash  . Chocolate Flavor Hives  . Fish-Derived Products Hives    Allergic to all seafood.  . Metronidazole Hives  . Cefazolin Hives    Developed local urticaria after 1.2gm ancef administration intraoperatively. Medication discontinued, no additional intervention required.  . Iopamidol Hives and Itching    Delayed reaction approx. 20 minutes post contrast.  . Beef-Derived Products Hives  . Fish Allergy Hives  . Galactose Rash    POSSIBLE ALPHA GAL ALLERGY - rash reaction to all beef and chicken    VITALS:  Blood pressure 112/67, pulse 85, temperature 97.6 F (36.4 C), temperature source Oral, resp. rate 20, height 5\' 4"  (1.626 m), weight 108.9 kg (240 lb), SpO2 100 %.  PHYSICAL EXAMINATION:  Constitutional: Appears well-developed and well-nourished. No distress. HENT: Normocephalic. Marland Kitchen  Oropharynx is clear and moist.  Eyes: Conjunctivae and EOM are normal. PERRLA, no scleral icterus.  Neck: Normal ROM. Neck supple. No JVD. No tracheal deviation. CVS: RRR, S1/S2 +, no murmurs, no gallops, no carotid bruit.  Pulmonary: Bilateral prolonged expiratory wheezing without rales or crackles Abdominal: Soft. BS +,  no distension, tenderness, rebound or guarding.  Musculoskeletal: Normal range of motion. No edema and no tenderness.  Neuro: Alert. CN 2-12 grossly intact. No focal deficits. Skin: Skin is warm and dry. No rash noted. Psychiatric: Normal mood and affect.      LABORATORY PANEL:   CBC Recent Labs  Lab 09/10/17 0505  WBC 8.7  HGB 11.4*  HCT 34.0*  PLT 211   ------------------------------------------------------------------------------------------------------------------  Chemistries  Recent Labs  Lab 09/10/17 0004  09/11/17 0340  NA 140   < > 137  K 3.4*   < > 4.1  CL 107   < > 107  CO2 26   < > 25  GLUCOSE 112*   < > 196*  BUN 18   < > 25*  CREATININE 1.13*   < > 1.12*  CALCIUM 9.2   < > 9.2  AST 22  --   --   ALT 12*  --   --   ALKPHOS 88  --   --   BILITOT 0.5  --   --    < > = values in this interval not displayed.   ------------------------------------------------------------------------------------------------------------------  Cardiac Enzymes Recent Labs  Lab 09/10/17 0004  TROPONINI <0.03   ------------------------------------------------------------------------------------------------------------------  RADIOLOGY:  Dg Chest 1 View  Result Date: 09/10/2017 CLINICAL DATA:  Shortness of breath. EXAM: CHEST 1 VIEW COMPARISON:  None. FINDINGS: Borderline mild cardiomegaly. Normal  mediastinal contours. No pulmonary edema. No consolidation, pleural fluid or pneumothorax. No acute osseous abnormality. IMPRESSION: Borderline mild cardiomegaly.  No acute pulmonary process. Electronically Signed   By: Jeb Levering M.D.   On: 09/10/2017  00:35     ASSESSMENT AND PLAN:   51 year old female with history of well-controlled asthma and tobacco dependence who presents with shortness of breath.  1. Acute hypoxic respiratory failure in the setting of mild intermittent asthma exacerbation Plan to wean oxygen today to room air.   2. Acute asthma exacerbation: Continue current dose of IV steroids due to significant bilateral prolonged expiratory wheezing. Continue inhalers and nebulizer treatments.  Patient will need long-acting inhaler upon discharge  3.Tobacco dependence: Patient is encouraged to quit smoking.  She does not want nicotine patch.   4. Hypokalemia: This was resolved after replacement.  5. Essential hypertension: Continue Norvasc and HCTZ  6. GERD: Continue PPI and Pepcid.    Management plans discussed with the patient and she is in agreement.  CODE STATUS: full  TOTAL TIME TAKING CARE OF THIS PATIENT: 28 minutes.    PT evaluation for discharge planning. POSSIBLE D/C 1-2 days, DEPENDING ON CLINICAL CONDITION.   Malayla Granberry M.D on 09/11/2017 at 8:07 AM  Between 7am to 6pm - Pager - [phone] After 6pm go to [email or web address] - password [email or web address] [facility]  Office  [phone]  CC: Primary care physician; System, Pcp Not In  Note: This dictation was prepared with Dragon dictation along with smaller phrase technology. Any transcriptional errors that result from this process are unintentional.

## 2017-09-11 NOTE — Progress Notes (Signed)
Patient weaned to room air, tolerating without difficulty. Patient a&o, VSS. No complaints at this time. Continue to monitor.

## 2017-09-12 ENCOUNTER — Encounter: Payer: Self-pay | Admitting: Internal Medicine

## 2017-09-12 LAB — HIV ANTIBODY (ROUTINE TESTING W REFLEX): HIV Screen 4th Generation wRfx: NONREACTIVE

## 2017-09-12 MED ORDER — AZITHROMYCIN 250 MG PO TABS: ORAL_TABLET | ORAL | 0 refills | Status: AC

## 2017-09-12 MED ORDER — MOMETASONE FURO-FORMOTEROL FUM 200-5 MCG/ACT IN AERO
2.0000 | INHALATION_SPRAY | Freq: Two times a day (BID) | RESPIRATORY_TRACT | 0 refills | Status: DC
Start: 2017-09-12 — End: 2019-09-17

## 2017-09-12 MED ORDER — IPRATROPIUM-ALBUTEROL 0.5-2.5 (3) MG/3ML IN SOLN
3.0000 mL | Freq: Three times a day (TID) | RESPIRATORY_TRACT | Status: DC
  Administered 2017-09-12: 3 mL via RESPIRATORY_TRACT
  Filled 2017-09-12: qty 3

## 2017-09-12 MED ORDER — IPRATROPIUM-ALBUTEROL 0.5-2.5 (3) MG/3ML IN SOLN: 3.0000 mL | Freq: Four times a day (QID) | RESPIRATORY_TRACT | 0 refills | Status: DC | PRN

## 2017-09-12 MED ORDER — PREDNISONE 50 MG PO TABS: 50.0000 mg | ORAL_TABLET | Freq: Every day | ORAL | 0 refills | Status: AC

## 2017-09-12 MED ORDER — BENZONATATE 200 MG PO CAPS: 200.0000 mg | ORAL_CAPSULE | Freq: Three times a day (TID) | ORAL | 0 refills | Status: DC

## 2017-09-12 NOTE — Progress Notes (Signed)
Lenoria Farrier to be D/C'd Home per MD order.  Discussed with the patient and all questions fully answered.  VSS, Skin clean, dry and intact without evidence of skin break down, no evidence of skin tears noted. IV catheter discontinued intact. Site without signs and symptoms of complications. Dressing and pressure applied.  An After Visit Summary was printed and given to the patient. Patient received prescription.  D/c education completed with patient/family including follow up instructions, medication list, d/c activities limitations if indicated, with other d/c instructions as indicated by MD - patient able to verbalize understanding, all questions fully answered.   Patient instructed to return to ED, call 911, or call MD for any changes in condition.   Patient escorted via [place], and D/C home via private auto.  Deri Fuelling 09/12/2017 11:04 AM

## 2017-09-12 NOTE — Progress Notes (Signed)
Patient (Wendy Ramos) reports there is mold in her current residential facility. If this is the case this is most likely aggravating her underlying pulmonary disease (in addition to ongoing tobacco use) and the mold should be removed/taken care of or she should try to find another place of living.  If she is exposed to other household cleaning supplies or fumes she should wear a face mask.        Call Bettey Costa MD with questions.  Daryel Kenneth M.D on 09/12/2017,at 12:28 PM  [place] at [facility]  [phone]

## 2017-09-12 NOTE — Discharge Summary (Signed)
[facility] [place] NAME: Wendy Ramos    MR#:  [id number]  DATE OF BIRTH:  09/18/1966  DATE OF ADMISSION:  09/09/2017 ADMITTING PHYSICIAN: Saundra Shelling, MD  DATE OF DISCHARGE: 09/12/2017  PRIMARY CARE PHYSICIAN: System, Pcp Not In    ADMISSION DIAGNOSIS:  Shortness of breath [R06.02] Hypoxia [R09.02] Moderate persistent asthma with exacerbation [J45.41]  DISCHARGE DIAGNOSIS:  Active Problems:   Asthma exacerbation   SECONDARY DIAGNOSIS:   Past Medical History:  Diagnosis Date  . Asthma   . Cancer ([place])   . COPD (chronic obstructive pulmonary disease) [facility])     HOSPITAL COURSE:   51 year old female with history of well-controlled asthma and tobacco dependence who presents with shortness of breath.  1. Acute hypoxic respiratory failure in the setting of mild intermittent asthma exacerbation/COPD exacerbation Patient has been weaned to room air  2. Acute asthma exacerbation/COPD: Patient has reported that she has a diagnosis COPD. She was treated with IV steroids. She will be discharged with prednisone as well as azithromycin for acute bronchitis due to COPD exacerbation. On examination at the time of discharge she has very minimal wheezing. She has good airflow. She will be discharged with nebulizer and long-acting inhaler in addition to her short acting inhaler. She will also be referred to pulmonary rehabilitation.   3.Tobacco dependence: Patient is encouraged to quit smoking.  She Was counseled while in the hospital. 4. Hypokalemia: This was resolved after replacement.  5. Essential hypertension: Continue Norvasc and HCTZ  6. GERD: Continue PPI and Pepcid.     DISCHARGE CONDITIONS AND DIET:   Patient is stable for discharge Heart healthy diet  CONSULTS OBTAINED:    DRUG ALLERGIES:   Allergies  Allergen Reactions  . Chicken Allergy Rash  . Chocolate Flavor Hives  . Fish-Derived Products Hives   Allergic to all seafood.  . Metronidazole Hives  . Cefazolin Hives    Developed local urticaria after 1.2gm ancef administration intraoperatively. Medication discontinued, no additional intervention required.  . Iopamidol Hives and Itching    Delayed reaction approx. 20 minutes post contrast.  . Beef-Derived Products Hives  . Fish Allergy Hives  . Galactose Rash    POSSIBLE ALPHA GAL ALLERGY - rash reaction to all beef and chicken    DISCHARGE MEDICATIONS:   Current Discharge Medication List    START taking these medications   Details  azithromycin (ZITHROMAX Z-PAK) 250 MG tablet Take 2 tablets (500 mg) on  Day 1,  followed by 1 tablet (250 mg) once daily on Days 2 through 5. Qty: 6 each, Refills: 0    benzonatate (TESSALON) 200 MG capsule Take 1 capsule (200 mg total) by mouth 3 (three) times daily. Qty: 20 capsule, Refills: 0    ipratropium-albuterol (DUONEB) 0.5-2.5 (3) MG/3ML SOLN Take 3 mLs by nebulization every 6 (six) hours as needed (sob wheezing). Qty: 360 mL, Refills: 0    mometasone-formoterol (DULERA) 200-5 MCG/ACT AERO Inhale 2 puffs into the lungs 2 (two) times daily. Qty: 1 Inhaler, Refills: 0    predniSONE (DELTASONE) 50 MG tablet Take 1 tablet (50 mg total) by mouth daily with breakfast for 4 days. Qty: 4 tablet, Refills: 0      CONTINUE these medications which have NOT CHANGED   Details  amLODipine (NORVASC) 10 MG tablet Take 10 mg by mouth daily. Refills: 3    buPROPion (WELLBUTRIN SR) 100 MG 12 hr tablet Take 100 mg by mouth 2 (two) times daily.  Refills: 3    cetirizine (ZYRTEC) 5 MG tablet Take 5 mg by mouth daily. Refills: 3    famotidine (PEPCID) 20 MG tablet Take 20 mg by mouth 2 (two) times daily.     hydrochlorothiazide (HYDRODIURIL) 25 MG tablet Take 25 mg by mouth daily. Refills: 11    meloxicam (MOBIC) 15 MG tablet Take 15 mg by mouth daily. Refills: 3    nicotine polacrilex (NICORETTE) 2 MG gum Take 2 mg by mouth as needed for smoking  cessation.    omeprazole (PRILOSEC) 40 MG capsule Take 40 mg by mouth 2 (two) times daily.    potassium chloride (K-DUR) 10 MEQ tablet 10 mEq 2 (two) times daily. Refills: 11    PROAIR HFA 108 (90 Base) MCG/ACT inhaler Inhale 2 puffs into the lungs every 6 (six) hours as needed for wheezing. Refills: 1    methocarbamol (ROBAXIN) 500 MG tablet Take 2 tablets (1,000 mg total) by mouth 4 (four) times daily as needed for muscle spasms (muscle spasm/pain). Qty: 25 tablet, Refills: 0      STOP taking these medications     HYDROcodone-acetaminophen (NORCO/VICODIN) 5-325 MG tablet           Today   CHIEF COMPLAINT:  And doing well this morning. Walk with physical therapy without oxygen. Has some mild shortness of breath however much improved and would like to go home if possible today.   VITAL SIGNS:  Blood pressure (!) 144/85, pulse 75, temperature 98 F (36.7 C), temperature source Oral, resp. rate 18, height 5\' 4"  (1.626 m), weight 108.9 kg (240 lb), SpO2 98 %.   REVIEW OF SYSTEMS:  Review of Systems  Constitutional: Negative.  Negative for chills, fever and malaise/fatigue.  HENT: Negative.  Negative for ear discharge, ear pain, hearing loss, nosebleeds and sore throat.   Eyes: Negative.  Negative for blurred vision and pain.  Respiratory: Positive for cough, shortness of breath (better) and wheezing (better). Negative for hemoptysis.   Cardiovascular: Negative.  Negative for chest pain, palpitations and leg swelling.  Gastrointestinal: Negative.  Negative for abdominal pain, blood in stool, diarrhea, nausea and vomiting.  Genitourinary: Negative.  Negative for dysuria.  Musculoskeletal: Positive for joint pain. Negative for back pain.  Skin: Negative.   Neurological: Negative for dizziness, tremors, speech change, focal weakness, seizures and headaches.  Endo/Heme/Allergies: Negative.  Does not bruise/bleed easily.  Psychiatric/Behavioral: Negative.  Negative for  depression, hallucinations and suicidal ideas.     PHYSICAL EXAMINATION:  GENERAL:  51 y.o.-year-old patient lying in the bed with no acute distress.  NECK:  Supple, no jugular venous distention. No thyroid enlargement, no tenderness.  LUNGS: Good airflow with minimal wheezing on expiration CARDIOVASCULAR: S1, S2 normal. No murmurs, rubs, or gallops.  ABDOMEN: Soft, non-tender, non-distended. Bowel sounds present. No organomegaly or mass.  EXTREMITIES: No pedal edema, cyanosis, or clubbing.  PSYCHIATRIC: The patient is alert and oriented x 3.  SKIN: No obvious rash, lesion, or ulcer.   DATA REVIEW:   CBC Recent Labs  Lab 09/10/17 0505  WBC 8.7  HGB 11.4*  HCT 34.0*  PLT 211    Chemistries  Recent Labs  Lab 09/10/17 0004  09/11/17 0340  NA 140   < > 137  K 3.4*   < > 4.1  CL 107   < > 107  CO2 26   < > 25  GLUCOSE 112*   < > 196*  BUN 18   < > 25*  CREATININE 1.13*   < >  1.12*  CALCIUM 9.2   < > 9.2  AST 22  --   --   ALT 12*  --   --   ALKPHOS 88  --   --   BILITOT 0.5  --   --    < > = values in this interval not displayed.    Cardiac Enzymes Recent Labs  Lab 09/10/17 0004  TROPONINI <0.03    Microbiology Results  @MICRORSLT48 @  RADIOLOGY:  No results found.    Current Discharge Medication List    START taking these medications   Details  azithromycin (ZITHROMAX Z-PAK) 250 MG tablet Take 2 tablets (500 mg) on  Day 1,  followed by 1 tablet (250 mg) once daily on Days 2 through 5. Qty: 6 each, Refills: 0    benzonatate (TESSALON) 200 MG capsule Take 1 capsule (200 mg total) by mouth 3 (three) times daily. Qty: 20 capsule, Refills: 0    ipratropium-albuterol (DUONEB) 0.5-2.5 (3) MG/3ML SOLN Take 3 mLs by nebulization every 6 (six) hours as needed (sob wheezing). Qty: 360 mL, Refills: 0    mometasone-formoterol (DULERA) 200-5 MCG/ACT AERO Inhale 2 puffs into the lungs 2 (two) times daily. Qty: 1 Inhaler, Refills: 0    predniSONE (DELTASONE) 50  MG tablet Take 1 tablet (50 mg total) by mouth daily with breakfast for 4 days. Qty: 4 tablet, Refills: 0      CONTINUE these medications which have NOT CHANGED   Details  amLODipine (NORVASC) 10 MG tablet Take 10 mg by mouth daily. Refills: 3    buPROPion (WELLBUTRIN SR) 100 MG 12 hr tablet Take 100 mg by mouth 2 (two) times daily. Refills: 3    cetirizine (ZYRTEC) 5 MG tablet Take 5 mg by mouth daily. Refills: 3    famotidine (PEPCID) 20 MG tablet Take 20 mg by mouth 2 (two) times daily.     hydrochlorothiazide (HYDRODIURIL) 25 MG tablet Take 25 mg by mouth daily. Refills: 11    meloxicam (MOBIC) 15 MG tablet Take 15 mg by mouth daily. Refills: 3    nicotine polacrilex (NICORETTE) 2 MG gum Take 2 mg by mouth as needed for smoking cessation.    omeprazole (PRILOSEC) 40 MG capsule Take 40 mg by mouth 2 (two) times daily.    potassium chloride (K-DUR) 10 MEQ tablet 10 mEq 2 (two) times daily. Refills: 11    PROAIR HFA 108 (90 Base) MCG/ACT inhaler Inhale 2 puffs into the lungs every 6 (six) hours as needed for wheezing. Refills: 1    methocarbamol (ROBAXIN) 500 MG tablet Take 2 tablets (1,000 mg total) by mouth 4 (four) times daily as needed for muscle spasms (muscle spasm/pain). Qty: 25 tablet, Refills: 0      STOP taking these medications     HYDROcodone-acetaminophen (NORCO/VICODIN) 5-325 MG tablet            Management plans discussed with the patient and she is in agreement. Stable for discharge home  Patient should follow up with pcp  CODE STATUS:     Code Status Orders  (From admission, onward)        Start     Ordered   09/10/17 0321  Full code  Continuous     09/10/17 0321    Code Status History    Date Active Date Inactive Code Status Order ID Comments User Context   This patient has a current code status but no historical code status.      TOTAL TIME  TAKING CARE OF THIS PATIENT: 38 minutes.    Note: This dictation was prepared with  Dragon dictation along with smaller phrase technology. Any transcriptional errors that result from this process are unintentional.  Korver Graybeal M.D on 09/12/2017 at 8:36 AM  Between 7am to 6pm - Pager - [phone] After 6pm go to [email or web address] - password [email or web address] [place] [facility]  Office  [phone]  CC: Primary care physician; System, Pcp Not In

## 2017-09-12 NOTE — Care Management Note (Signed)
Case Management Note  Patient Details  Name: Wendy Ramos MRN: [id number] Date of Birth: 31-May-1966  Subjective/Objective: Confirmed that patient wants out patient rehab. No agency choice. Orders faxed to [facility] rehab. Ordered home nebulizer from [facility]. No other needs identified.                    Action/Plan:   Expected Discharge Date:  09/12/17               Expected Discharge Plan:  OP Rehab  In-House Referral:     Discharge planning Services  CM Consult  Post Acute Care Choice:  Durable Medical Equipment Choice offered to:  Patient  DME Arranged:  Nebulizer machine DME Agency:  [place]:    [facility] Agency:     Status of Service:  Completed, signed off  If discussed at [place] of Stay Meetings, dates discussed:    Additional Comments:  Jolly Mango, RN 09/12/2017, 9:10 AM

## 2017-09-14 ENCOUNTER — Encounter: Payer: Self-pay | Admitting: Physical Therapy

## 2017-09-14 ENCOUNTER — Ambulatory Visit: Payer: Medicaid Other | Attending: Internal Medicine | Admitting: Physical Therapy

## 2017-09-14 DIAGNOSIS — G8929 Other chronic pain: Secondary | ICD-10-CM

## 2017-09-14 DIAGNOSIS — R2681 Unsteadiness on feet: Secondary | ICD-10-CM | POA: Insufficient documentation

## 2017-09-14 DIAGNOSIS — M25561 Pain in right knee: Secondary | ICD-10-CM | POA: Diagnosis present

## 2017-09-14 DIAGNOSIS — M6281 Muscle weakness (generalized): Secondary | ICD-10-CM | POA: Diagnosis present

## 2017-09-14 DIAGNOSIS — M25562 Pain in left knee: Secondary | ICD-10-CM | POA: Insufficient documentation

## 2017-09-14 NOTE — Therapy (Signed)
[place] MAIN [facility] SERVICES [address] [place], [place], [address] Phone: [phone]   Fax:  [phone]  Physical Therapy Evaluation  Patient Details  Name: Wendy Ramos MRN: [id number] Date of Birth: Aug 08, 1966 Referring Provider: Dr. Bettey Costa   Encounter Date: 09/14/2017  PT End of Session - 09/14/17 1111    Visit Number  1    Number of Visits  9    Date for PT Re-Evaluation  11/09/17    Authorization Type  medicaid    PT Start Time  1017    PT Stop Time  1110    PT Time Calculation (min)  53 min    Equipment Utilized During Treatment  Gait belt    Activity Tolerance  Patient limited by pain    Behavior During Therapy  [facility] for tasks assessed/performed       Past Medical History:  Diagnosis Date  . Asthma   . Cancer [facility])    kidney cancer- 2014;   . COPD (chronic obstructive pulmonary disease) ([place])     Past Surgical History:  Procedure Laterality Date  . ABDOMINAL HYSTERECTOMY    . CHOLECYSTECTOMY    . KIDNEY SURGERY      There were no vitals filed for this visit.   Subjective Assessment - 09/14/17 1026    Subjective  "I am having chronic back and knee pain and its not any better."     Pertinent History  51 yo Female reports impaired balance with frequent falls in last 6 months. She did recently receive outpatient PT for back/knee pain without improvement; She reports that she was given exercise which made it worse; She reports that she was working part time but now she can't work at all because she is in pain all the time; Patient reports that her knees give out which causes her to fall and experience incontinence; She reports severe back pain today; She reports having numbness/tinging in fingers; She reports occasional numbness/tinging in feet; Patient reports 3 falls in last month; She does not use any assistive devices at this time. She states that she is going to get a cane this weekend to start using; She denies any  dizziness;     Limitations  Standing;Walking    How long can you sit comfortably?  not long; 15 min; shifts often;     How long can you stand comfortably?  15 min;     How long can you walk comfortably?  about 500 feet;     Diagnostic tests  Xrays in July 2018 shows severe disc degeneration;     Currently in Pain?  Yes    Pain Score  9     Pain Location  Back    Pain Orientation  Lower    Pain Descriptors / Indicators  Aching;Throbbing    Pain Type  Chronic pain    Pain Radiating Towards  radiates into tailbone/sacrum;     Pain Onset  More than a month ago    Pain Frequency  Intermittent    Aggravating Factors   worse with prolonged sitting/standing; worse with activity    Pain Relieving Factors  rest/heat/ice- only provides temporary relief;     Effect of Pain on Daily Activities  decreased activity tolerance;     Multiple Pain Sites  Yes    Pain Score  7    Pain Location  Knee    Pain Orientation  Right;Left    Pain Descriptors / Indicators  Tender;Throbbing    Pain Type  Chronic pain    Pain Onset  More than a month ago    Pain Frequency  Intermittent    Aggravating Factors   standing/walking    Pain Relieving Factors  rest    Effect of Pain on Daily Activities  decreased standing/work tolerance;          [facility] PT Assessment - 09/14/17 0001      Assessment   Medical Diagnosis  frequent falls    Referring Provider  Dr. Bettey Costa    Onset Date/Surgical Date  02/15/17    Hand Dominance  Right    Next MD Visit  unsure    Prior Therapy  had PT about 2 months ago for knee/back pain without relief;       Precautions   Precautions  Fall      Restrictions   Weight Bearing Restrictions  No      Balance Screen   Has the patient fallen in the past 6 months  Yes    How many times?  3+    Has the patient had a decrease in activity level because of a fear of falling?   Yes    Is the patient reluctant to leave their home because of a fear of falling?   Yes      Home  Environment   Additional Comments  lives in single story home; has 3 steps to enter with B rail assist; uses hand rail; negotiates 1 step at a time; mod I for self care ADLs; she is cooking/cleaning currently;       Prior Function   Level of Independence  Independent    Vocation  -- not working right now    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;     Leisure  likes to fish, walk, work;       Charity fundraiser Status  Within Functional Limits for tasks assessed      Observation/Other Assessments   Observations  anxious, constantly shifting due to chronic pain in  back/knees;       Sensation   Light Touch  Appears Intact    Proprioception  Appears Intact      Coordination   Gross Motor Movements are Fluid and Coordinated  Yes    Fine Motor Movements are Fluid and Coordinated  Yes    Finger Nose Finger Test  accurate BUE      Posture/Postural Control   Posture Comments  stands with increased lumbar lordosis, mild slumped posture with mild forward head;       AROM   Overall AROM Comments  BUE and BLE are [facility]      Strength   Overall Strength Comments  BUE grossly 5/5    Right Hip Flexion  4-/5    Right Hip ABduction  4-/5    Right Hip ADduction  4-/5    Left Hip Flexion  4-/5    Left Hip ABduction  4-/5    Left Hip ADduction  4-/5    Right Knee Flexion  4-/5    Right Knee Extension  4/5    Left Knee Flexion  4-/5    Left Knee Extension  4/5    Right Ankle Dorsiflexion  4/5    Left Ankle Dorsiflexion  4/5      Palpation   Patella mobility  normal mobility LLE, mild hypomobility of RLE but this  is related to pain; has increased discomfort with medial glide of RLE; exhibits neutral glide of patella with quad set;     Palpation comment  severe tenderness to B knee anterior along patella; Severe tenderness to lumbar paraspinals including paraspinals with pain radiating to B PSIS;       Transfers   Comments  requires 2  HHA to push up from chair for transfers; modI       Ambulation/Gait   Gait Comments  ambulates with reciprocal gait pattern, increased knee hyperextension in stance, wide base of support, slightly slower gait speed;       Standardized Balance Assessment   Five times sit to stand comments   21 sec with pushing with BUE, >10 sec indicates high fall risk    10 Meter Walk  1.05 m/s without AD, community ambulator, low fall risk      High Level Balance   High Level Balance Comments  SLS: R: 7 sec, L: 8 sec with increased sway; standing on firm surface, feet slightly apart, eyes open: no sway, eyes closed: mild sway but stable; standing on foam: eyes open: no sway, eyes closed: mild-moderate sway bumping into parallel bars;              Objective measurements completed on examination: See above findings.              PT Education - 09/14/17 1110    Education provided  Yes    Education Details  recommendations;     Person(s) Educated  Patient    Methods  Explanation    Comprehension  Verbalized understanding       PT Short Term Goals - 09/14/17 1251      PT SHORT TERM GOAL #1   Title  Patient will be adherent with HEP at least 3x a week over last 2 weeks to exhibit improved strength/mobility and improve tolerance with ADLs;     Baseline  does not have a HEP and has not been exercising;     Time  4    Period  Weeks    Status  New    Target Date  10/12/17      PT SHORT TERM GOAL #2   Title  Patient will be able to transfer sit<>Stand from regular chair without pushing on chair to exhibit improved tolerance with transfers;     Baseline  requires 2 HHA to push up from chair with increased knee pain and difficulty with transfers;     Time  4    Period  Weeks    Status  New    Target Date  10/12/17      PT SHORT TERM GOAL #3   Title  Patient will deny any falls over last 2 weeks to exhibit improved mobility at home and reduce fall risk;     Baseline  reports 3 falls  in last month;     Time  4    Period  Weeks    Status  New    Target Date  10/12/17        PT Long Term Goals - 09/14/17 1252      PT LONG TERM GOAL #1   Title  Patient will be independent in home exercise program to improve strength/mobility for better functional independence with ADLs.    Baseline  does not have a HEP    Time  8    Period  Weeks    Status  New  Target Date  11/09/17      PT LONG TERM GOAL #2   Title   Patient (< 3 years old) will complete five times sit to stand test in < 15 seconds indicating an increased LE strength and improved balance.    Baseline  unable to do 5 times sit<>stand due to weakness and knee pain;     Time  8    Period  Weeks    Status  New    Target Date  11/09/17      PT LONG TERM GOAL #3   Title  Patient will ascend/descend 4 stairs without rail assist independently without loss of balance to improve ability to get in/out of home.     Baseline  requires 1 rail assist for stair negotiation and negotiates 1 step at a time;     Time  8    Period  Weeks    Status  New    Target Date  11/09/17      PT LONG TERM GOAL #4   Title  Patient will increase BLE gross strength to 4+/5 as to improve functional strength for independent gait, increased standing tolerance and increased ADL ability.    Baseline  BLE grossly 4-/5    Time  8    Period  Weeks    Status  New    Target Date  11/09/17      PT LONG TERM GOAL #5   Title  Patient will report a worst pain of 3/10 on VAS in  knee           to improve tolerance with ADLs and reduced symptoms with activities.     Baseline  6-7/10 knee pain is worst knee pain;     Time  8    Period  Weeks    Status  New    Target Date  11/09/17             Plan - 09/14/17 1112    Clinical Impression Statement  51 yo Female reports chronic back and bilateral knee pain; patient reports multiple falls but states that this is related to bilateral knee pain; She recently had outpatient PT for knee/back  pain which per patient did not help. She reports getting a lot of exercises and states that increased her pain; Patient exhibits increased weakness in BLE which is likely related to chronic pain; She stands with increased lumbar lordosis with severe tenderness to lumbar paraspinals and sacral paraspinals; Patient also has moderate tenderness to bilateral patella; She tested as a low fall risk  She is able to hold SLS for at least 7 sec each LE; She does ambulate with increased hyperextension of BLE knee with gait with wide base of support; Patient would benefit from skilled PT intervention to improve LE strength and reduce fall risk;     History and Personal Factors relevant to plan of care:  chronic back/knee pain; progressive OA, obesity, not working, has steps to enter house, multiple falls due to "knee buckling"     Clinical Presentation  Evolving    Clinical Presentation due to:  chronic pain with multiple falls;     Clinical Decision Making  Moderate    Rehab Potential  Fair    Clinical Impairments Affecting Rehab Potential  chronic condition with no improvement from previous PT    PT Frequency  1x / week    PT Duration  8 weeks    PT Treatment/Interventions  Aquatic Therapy;Cryotherapy;Electrical Stimulation;Moist  Heat;Therapeutic exercise;Therapeutic activities;Functional mobility training;Stair training;Gait training;DME Instruction;Balance training;Neuromuscular re-education;Patient/family education;Manual techniques;Taping;Energy conservation;Passive range of motion    PT Next Visit Plan  address HEP- patient is supposed to bring HEP from [place]  will address next session;     Consulted and Agree with Plan of Care  Patient       Patient will benefit from skilled therapeutic intervention in order to improve the following deficits and impairments:  Decreased endurance, Obesity, Decreased strength, Pain, Difficulty walking, Decreased mobility, Decreased balance,  Postural dysfunction, Improper body mechanics  Visit Diagnosis: Chronic pain of left knee  Chronic pain of right knee  Muscle weakness (generalized)  Unsteadiness on feet     Problem List Patient Active Problem List   Diagnosis Date Noted  . Asthma exacerbation 09/10/2017    Trotter,Margaret PT, DPT 09/14/2017, 12:59 PM  [place] MAIN [facility] SERVICES [address] [place], [place], [address] Phone: [phone]   Fax:  [phone]  Name: Naureen Benton MRN: [id number] Date of Birth: 12-28-1965

## 2017-09-21 ENCOUNTER — Ambulatory Visit: Payer: Medicaid Other | Attending: Internal Medicine | Admitting: Physical Therapy

## 2017-09-27 ENCOUNTER — Ambulatory Visit: Payer: Medicaid Other | Admitting: Physical Therapy

## 2017-10-03 ENCOUNTER — Other Ambulatory Visit: Payer: Self-pay

## 2017-10-03 ENCOUNTER — Encounter: Payer: Medicaid Other | Attending: Internal Medicine

## 2017-10-03 VITALS — Ht 64.8 in | Wt 258.3 lb

## 2017-10-03 DIAGNOSIS — J45909 Unspecified asthma, uncomplicated: Secondary | ICD-10-CM | POA: Insufficient documentation

## 2017-10-03 DIAGNOSIS — J4521 Mild intermittent asthma with (acute) exacerbation: Secondary | ICD-10-CM

## 2017-10-03 NOTE — Progress Notes (Signed)
Pulmonary Individual Treatment Plan  Patient Details  Name: Wendy Ramos MRN: [id number] Date of Birth: Mar 08, 1966 Referring Provider:     Pulmonary Rehab from 10/03/2017 in [facility] Cardiac and Pulmonary Rehab  Referring Provider  Lynder Parents MD      Initial Encounter Date:    Pulmonary Rehab from 10/03/2017 in [facility] Cardiac and Pulmonary Rehab  Date  10/03/17  Referring Provider  Lynder Parents MD      Visit Diagnosis: Mild intermittent asthma with (acute) exacerbation  Patient's Home Medications on Admission:  Current Outpatient Medications:  .  amLODipine (NORVASC) 10 MG tablet, Take 10 mg by mouth daily., Disp: , Rfl: 3 .  benzonatate (TESSALON) 200 MG capsule, Take 1 capsule (200 mg total) by mouth 3 (three) times daily., Disp: 20 capsule, Rfl: 0 .  buPROPion (WELLBUTRIN SR) 100 MG 12 hr tablet, Take 100 mg by mouth 2 (two) times daily., Disp: , Rfl: 3 .  cetirizine (ZYRTEC) 5 MG tablet, Take 5 mg by mouth daily., Disp: , Rfl: 3 .  famotidine (PEPCID) 20 MG tablet, Take 20 mg by mouth 2 (two) times daily. , Disp: , Rfl:  .  hydrochlorothiazide (HYDRODIURIL) 25 MG tablet, Take 25 mg by mouth daily., Disp: , Rfl: 11 .  ipratropium-albuterol (DUONEB) 0.5-2.5 (3) MG/3ML SOLN, Take 3 mLs by nebulization every 6 (six) hours as needed (sob wheezing)., Disp: 360 mL, Rfl: 0 .  meloxicam (MOBIC) 15 MG tablet, Take 15 mg by mouth daily., Disp: , Rfl: 3 .  methocarbamol (ROBAXIN) 500 MG tablet, Take 2 tablets (1,000 mg total) by mouth 4 (four) times daily as needed for muscle spasms (muscle spasm/pain). (Patient not taking: Reported on 09/10/2017), Disp: 25 tablet, Rfl: 0 .  mometasone-formoterol (DULERA) 200-5 MCG/ACT AERO, Inhale 2 puffs into the lungs 2 (two) times daily., Disp: 1 Inhaler, Rfl: 0 .  nicotine polacrilex (NICORETTE) 2 MG gum, Take 2 mg by mouth as needed for smoking cessation., Disp: , Rfl:  .  omeprazole (PRILOSEC) 40 MG capsule, Take 40 mg by mouth 2 (two) times daily., Disp:  , Rfl:  .  potassium chloride (K-DUR) 10 MEQ tablet, 10 mEq 2 (two) times daily., Disp: , Rfl: 11 .  PROAIR HFA 108 (90 Base) MCG/ACT inhaler, Inhale 2 puffs into the lungs every 6 (six) hours as needed for wheezing., Disp: , Rfl: 1  Past Medical History: Past Medical History:  Diagnosis Date  . Asthma   . Cancer [facility])    kidney cancer- 2014;   . COPD (chronic obstructive pulmonary disease) (HCC)     Tobacco Use: Social History   Tobacco Use  Smoking Status Current Every Day Smoker  . Packs/day: 0.50  . Types: Cigarettes  Smokeless Tobacco Never Used  Tobacco Comment   states she smokes marijuana, Will give cessation materials upon return    Labs: Recent Review Flowsheet Data    There is no flowsheet data to display.       Pulmonary Assessment Scores: Pulmonary Assessment Scores    Row Name 10/03/17 1432         ADL UCSD   ADL Phase  Entry     SOB Score total  55     Rest  3     Walk  4     Stairs  4     Bath  2     Dress  2     Shop  1       CAT Score   CAT  Score  24       mMRC Score   mMRC Score  1        Pulmonary Function Assessment: Pulmonary Function Assessment - 10/03/17 1456      Breath   Shortness of Breath  Limiting activity;Yes       Exercise Target Goals: Date: 10/03/17  Exercise Program Goal: Individual exercise prescription set with THRR, safety & activity barriers. Participant demonstrates ability to understand and report RPE using BORG scale, to self-measure pulse accurately, and to acknowledge the importance of the exercise prescription.  Exercise Prescription Goal: Starting with aerobic activity 30 plus minutes a day, 3 days per week for initial exercise prescription. Provide home exercise prescription and guidelines that participant acknowledges understanding prior to discharge.  Activity Barriers & Risk Stratification: Activity Barriers & Cardiac Risk Stratification - 10/03/17 1538      Activity Barriers & Cardiac Risk  Stratification   Activity Barriers  Back Problems;Neck/Spine Problems;Joint Problems;Arthritis;Deconditioning;Muscular Weakness;Shortness of Breath DJD, knees give out on occasion       6 Minute Walk: 6 Minute Walk    Row Name 10/03/17 1536         6 Minute Walk   Phase  Initial     Distance  1235 feet     Walk Time  6 minutes     # of Rest Breaks  0     MPH  2.34     METS  2.53     RPE  11     Perceived Dyspnea   1     VO2 Peak  8.86     Symptoms  Yes (comment)     Comments  knee pain 4/10 (chronic)     Resting HR  68 bpm     Resting BP  124/70     Resting Oxygen Saturation   100 %     Exercise Oxygen Saturation  during 6 min walk  88 %     Max Ex. HR  79 bpm     Max Ex. BP  126/70     2 Minute Post BP  128/70       Interval HR   1 Minute HR  78     2 Minute HR  78     3 Minute HR  77     6 Minute HR  79     2 Minute Post HR  70     Interval Heart Rate?  Yes       Interval Oxygen   Interval Oxygen?  Yes     Baseline Oxygen Saturation %  100 %     1 Minute Oxygen Saturation %  94 %     1 Minute Liters of Oxygen  0 L Room Air     2 Minute Oxygen Saturation %  88 %     2 Minute Liters of Oxygen  0 L     3 Minute Oxygen Saturation %  88 %     3 Minute Liters of Oxygen  0 L     4 Minute Oxygen Saturation %  93 %     4 Minute Liters of Oxygen  0 L     5 Minute Oxygen Saturation %  92 %     5 Minute Liters of Oxygen  0 L     6 Minute Oxygen Saturation %  95 %     6 Minute Liters of Oxygen  0 L  2 Minute Post Oxygen Saturation %  100 %     2 Minute Post Liters of Oxygen  0 L       Oxygen Initial Assessment: Oxygen Initial Assessment - 10/03/17 1454      Home Oxygen   Home Oxygen Device  None    Sleep Oxygen Prescription  None    Home Exercise Oxygen Prescription  None    Home at Rest Exercise Oxygen Prescription  None      Initial 6 min Walk   Oxygen Used  None      Program Oxygen Prescription   Program Oxygen Prescription  None      Intervention     Short Term Goals  To learn and demonstrate proper use of respiratory medications;To learn and understand importance of maintaining oxygen saturations>88%;To learn and demonstrate proper pursed lip breathing techniques or other breathing techniques.;To learn and understand importance of monitoring SPO2 with pulse oximeter and demonstrate accurate use of the pulse oximeter.    Long  Term Goals  Verbalizes importance of monitoring SPO2 with pulse oximeter and return demonstration;Maintenance of O2 saturations>88%;Compliance with respiratory medication;Demonstrates proper use of MDI's;Exhibits proper breathing techniques, such as pursed lip breathing or other method taught during program session       Oxygen Re-Evaluation:   Oxygen Discharge (Final Oxygen Re-Evaluation):   Initial Exercise Prescription: Initial Exercise Prescription - 10/03/17 1500      Date of Initial Exercise RX and Referring Provider   Date  10/03/17    Referring Provider  Lynder Parents MD      Treadmill   MPH  2    Grade  0.5    Minutes  15    METs  2.67      NuStep   Level  2    SPM  80    Minutes  15    METs  2.5      REL-XR   Level  1    Speed  50    Minutes  15    METs  2.5      Prescription Details   Frequency (times per week)  2 can only attend on Mon and Friday    Duration  Progress to 45 minutes of aerobic exercise without signs/symptoms of physical distress      Intensity   THRR 40-80% of Max Heartrate  108-148    Ratings of Perceived Exertion  11-15    Perceived Dyspnea  0-4      Progression   Progression  Continue to progress workloads to maintain intensity without signs/symptoms of physical distress.      Resistance Training   Training Prescription  Yes    Weight  3 lbs    Reps  10-15       Perform Capillary Blood Glucose checks as needed.  Exercise Prescription Changes: Exercise Prescription Changes    Row Name 10/03/17 1500             Response to Exercise   Blood  Pressure (Admit)  124/70       Blood Pressure (Exercise)  126/70       Blood Pressure (Exit)  128/70       Heart Rate (Admit)  68 bpm       Heart Rate (Exercise)  79 bpm       Heart Rate (Exit)  70 bpm       Oxygen Saturation (Admit)  100 %       Oxygen Saturation (  Exercise)  88 %       Oxygen Saturation (Exit)  100 %       Rating of Perceived Exertion (Exercise)  11       Perceived Dyspnea (Exercise)  1       Symptoms  knee pain 4/10       Comments  walk test results          Exercise Comments:   Exercise Goals and Review: Exercise Goals    Row Name 10/03/17 1540             Exercise Goals   Increase Physical Activity  Yes       Intervention  Provide advice, education, support and counseling about physical activity/exercise needs.;Develop an individualized exercise prescription for aerobic and resistive training based on initial evaluation findings, risk stratification, comorbidities and participant's personal goals.       Expected Outcomes  Achievement of increased cardiorespiratory fitness and enhanced flexibility, muscular endurance and strength shown through measurements of functional capacity and personal statement of participant.       Increase Strength and Stamina  Yes       Intervention  Provide advice, education, support and counseling about physical activity/exercise needs.;Develop an individualized exercise prescription for aerobic and resistive training based on initial evaluation findings, risk stratification, comorbidities and participant's personal goals.       Expected Outcomes  Achievement of increased cardiorespiratory fitness and enhanced flexibility, muscular endurance and strength shown through measurements of functional capacity and personal statement of participant.       Able to understand and use rate of perceived exertion (RPE) scale  Yes       Intervention  Provide education and explanation on how to use RPE scale       Expected Outcomes  Short Term:  Able to use RPE daily in rehab to express subjective intensity level;Long Term:  Able to use RPE to guide intensity level when exercising independently       Able to understand and use Dyspnea scale  Yes       Intervention  Provide education and explanation on how to use Dyspnea scale       Expected Outcomes  Short Term: Able to use Dyspnea scale daily in rehab to express subjective sense of shortness of breath during exertion;Long Term: Able to use Dyspnea scale to guide intensity level when exercising independently       Knowledge and understanding of Target Heart Rate Range (THRR)  Yes       Intervention  Provide education and explanation of THRR including how the numbers were predicted and where they are located for reference       Expected Outcomes  Short Term: Able to state/look up THRR;Long Term: Able to use THRR to govern intensity when exercising independently;Short Term: Able to use daily as guideline for intensity in rehab       Able to check pulse independently  Yes       Intervention  Provide education and demonstration on how to check pulse in carotid and radial arteries.;Review the importance of being able to check your own pulse for safety during independent exercise       Expected Outcomes  Short Term: Able to explain why pulse checking is important during independent exercise;Long Term: Able to check pulse independently and accurately       Understanding of Exercise Prescription  Yes       Intervention  Provide education, explanation, and written materials  on patient's individual exercise prescription       Expected Outcomes  Long Term: Able to explain home exercise prescription to exercise independently;Short Term: Able to explain program exercise prescription          Exercise Goals Re-Evaluation :   Discharge Exercise Prescription (Final Exercise Prescription Changes): Exercise Prescription Changes - 10/03/17 1500      Response to Exercise   Blood Pressure (Admit)  124/70     Blood Pressure (Exercise)  126/70    Blood Pressure (Exit)  128/70    Heart Rate (Admit)  68 bpm    Heart Rate (Exercise)  79 bpm    Heart Rate (Exit)  70 bpm    Oxygen Saturation (Admit)  100 %    Oxygen Saturation (Exercise)  88 %    Oxygen Saturation (Exit)  100 %    Rating of Perceived Exertion (Exercise)  11    Perceived Dyspnea (Exercise)  1    Symptoms  knee pain 4/10    Comments  walk test results       Nutrition:  Target Goals: Understanding of nutrition guidelines, daily intake of sodium 1500mg , cholesterol 200mg , calories 30% from fat and 7% or less from saturated fats, daily to have 5 or more servings of fruits and vegetables.  Biometrics: Pre Biometrics - 10/03/17 1541      Pre Biometrics   Height  5' 4.8" (1.646 m)    Weight  258 lb 4.8 oz (117.2 kg)    Waist Circumference  43 inches    Hip Circumference  55 inches    Waist to Hip Ratio  0.78 %    BMI (Calculated)  43.24        Nutrition Therapy Plan and Nutrition Goals: Nutrition Therapy & Goals - 10/03/17 1443      Intervention Plan   Intervention  Prescribe, educate and counsel regarding individualized specific dietary modifications aiming towards targeted core components such as weight, hypertension, lipid management, diabetes, heart failure and other comorbidities.;Nutrition handout(s) given to patient.    Expected Outcomes  Short Term Goal: Understand basic principles of dietary content, such as calories, fat, sodium, cholesterol and nutrients.;Long Term Goal: Adherence to prescribed nutrition plan.       Nutrition Discharge: Rate Your Plate Scores:   Nutrition Goals Re-Evaluation:   Nutrition Goals Discharge (Final Nutrition Goals Re-Evaluation):   Psychosocial: Target Goals: Acknowledge presence or absence of significant depression and/or stress, maximize coping skills, provide positive support system. Participant is able to verbalize types and ability to use techniques and skills needed  for reducing stress and depression.   Initial Review & Psychosocial Screening: Initial Psych Review & Screening - 10/03/17 1456      Initial Review   Current issues with  Current Depression;Current Stress Concerns    Source of Stress Concerns  Chronic Illness;Retirement/disability;Unable to perform yard/household activities;Unable to participate in former interests or hobbies    Comments  Based off PHQ9 scores      Barriers   Psychosocial barriers to participate in program  The patient should benefit from training in stress management and relaxation.      Screening Interventions   Interventions  Yes;Encouraged to exercise;Program counselor consult;Provide feedback about the scores to participant;To provide support and resources with identified psychosocial needs;Other (comment)    Comments  patient would like to have treatment    Expected Outcomes  Short Term goal: Utilizing psychosocial counselor, staff and physician to assist with identification of specific Stressors or  current issues interfering with healing process. Setting desired goal for each stressor or current issue identified.;Long Term Goal: Stressors or current issues are controlled or eliminated.;Short Term goal: Identification and review with participant of any Quality of Life or Depression concerns found by scoring the questionnaire.;Long Term goal: The participant improves quality of Life and PHQ9 Scores as seen by post scores and/or verbalization of changes       Quality of Life Scores:   PHQ-9: Recent Review Flowsheet Data    Depression screen [facility] 2/9 10/03/2017   Decreased Interest 3   Down, Depressed, Hopeless 3   PHQ - 2 Score 6   Altered sleeping 3   Tired, decreased energy 3   Change in appetite 3   Feeling bad or failure about yourself  0   Trouble concentrating 2   Moving slowly or fidgety/restless 3   Suicidal thoughts 1    PHQ-9 Score 21   Difficult doing work/chores Extremely dIfficult      Interpretation of Total Score  Total Score Depression Severity:  1-4 = Minimal depression, 5-9 = Mild depression, 10-14 = Moderate depression, 15-19 = Moderately severe depression, 20-27 = Severe depression   Psychosocial Evaluation and Intervention:   Psychosocial Re-Evaluation:   Psychosocial Discharge (Final Psychosocial Re-Evaluation):   Education: Education Goals: Education classes will be provided on a weekly basis, covering required topics. Participant will state understanding/return demonstration of topics presented.  Learning Barriers/Preferences: Learning Barriers/Preferences - 10/03/17 1455      Learning Barriers/Preferences   Learning Barriers  Sight wears glasses    Learning Preferences  None       Education Topics: Initial Evaluation Education: - Verbal, written and demonstration of respiratory meds, RPE/PD scales, oximetry and breathing techniques. Instruction on use of nebulizers and MDIs: cleaning and proper use, rinsing mouth with steroid doses and importance of monitoring MDI activations.   Pulmonary Rehab from 10/03/2017 in [facility] Cardiac and Pulmonary Rehab  Date  10/03/17  Educator  [facility]  Instruction Review Code  1- Verbalizes Understanding      General Nutrition Guidelines/Fats and Fiber: -Group instruction provided by verbal, written material, models and posters to present the general guidelines for heart healthy nutrition. Gives an explanation and review of dietary fats and fiber.   Controlling Sodium/Reading Food Labels: -Group verbal and written material supporting the discussion of sodium use in heart healthy nutrition. Review and explanation with models, verbal and written materials for utilization of the food label.   Exercise Physiology & Risk Factors: - Group verbal and written instruction with models to review the exercise physiology of the cardiovascular system and associated critical values. Details cardiovascular disease risk factors and  the goals associated with each risk factor.   Aerobic Exercise & Resistance Training: - Gives group verbal and written discussion on the health impact of inactivity. On the components of aerobic and resistive training programs and the benefits of this training and how to safely progress through these programs.   Flexibility, Balance, General Exercise Guidelines: - Provides group verbal and written instruction on the benefits of flexibility and balance training programs. Provides general exercise guidelines with specific guidelines to those with heart or lung disease. Demonstration and skill practice provided.   Stress Management: - Provides group verbal and written instruction about the health risks of elevated stress, cause of high stress, and healthy ways to reduce stress.   Depression: - Provides group verbal and written instruction on the correlation between heart/lung disease and depressed mood, treatment options,  and the stigmas associated with seeking treatment.   Exercise & Equipment Safety: - Individual verbal instruction and demonstration of equipment use and safety with use of the equipment.   Pulmonary Rehab from 10/03/2017 in [facility] Cardiac and Pulmonary Rehab  Date  10/03/17  Educator  [facility]  Instruction Review Code  1- Verbalizes Understanding      Infection Prevention: - Provides verbal and written material to individual with discussion of infection control including proper hand washing and proper equipment cleaning during exercise session.   Pulmonary Rehab from 10/03/2017 in [facility] Cardiac and Pulmonary Rehab  Date  10/03/17  Educator  [facility]  Instruction Review Code  1- Verbalizes Understanding      Falls Prevention: - Provides verbal and written material to individual with discussion of falls prevention and safety.   Pulmonary Rehab from 10/03/2017 in [facility] Cardiac and Pulmonary Rehab  Date  10/03/17  Educator  [facility]  Instruction Review Code  1- Verbalizes Understanding       Diabetes: - Individual verbal and written instruction to review signs/symptoms of diabetes, desired ranges of glucose level fasting, after meals and with exercise. Advice that pre and post exercise glucose checks will be done for 3 sessions at entry of program.   Chronic Lung Diseases: - Group verbal and written instruction to review new updates, new respiratory medications, new advancements in procedures and treatments. Provide informative websites and "800" numbers of self-education.   Lung Procedures: - Group verbal and written instruction to describe testing methods done to diagnose lung disease. Review the outcome of test results. Describe the treatment choices: Pulmonary Function Tests, ABGs and oximetry.   Energy Conservation: - Provide group verbal and written instruction for methods to conserve energy, plan and organize activities. Instruct on pacing techniques, use of adaptive equipment and posture/positioning to relieve shortness of breath.   Triggers: - Group verbal and written instruction to review types of environmental controls: home humidity, furnaces, filters, dust mite/pet prevention, HEPA vacuums. To discuss weather changes, air quality and the benefits of nasal washing.   Exacerbations: - Group verbal and written instruction to provide: warning signs, infection symptoms, calling MD promptly, preventive modes, and value of vaccinations. Review: effective airway clearance, coughing and/or vibration techniques. Create an Sports administrator.   Oxygen: - Individual and group verbal and written instruction on oxygen therapy. Includes supplement oxygen, available portable oxygen systems, continuous and intermittent flow rates, oxygen safety, concentrators, and Medicare reimbursement for oxygen.   Respiratory Medications: - Group verbal and written instruction to review medications for lung disease. Drug class, frequency, complications, importance of spacers, rinsing mouth  after steroid MDI's, and proper cleaning methods for nebulizers.   AED/CPR: - Group verbal and written instruction with the use of models to demonstrate the basic use of the AED with the basic ABC's of resuscitation.   Breathing Retraining: - Provides individuals verbal and written instruction on purpose, frequency, and proper technique of diaphragmatic breathing and pursed-lipped breathing. Applies individual practice skills.   Pulmonary Rehab from 10/03/2017 in [facility] Cardiac and Pulmonary Rehab  Date  10/03/17  Educator  [facility]  Instruction Review Code  1- Verbalizes Understanding      Anatomy and Physiology of the Lungs: - Group verbal and written instruction with the use of models to provide basic lung anatomy and physiology related to function, structure and complications of lung disease.   Anatomy & Physiology of the Heart: - Group verbal and written instruction and models provide basic cardiac anatomy and physiology, with  the coronary electrical and arterial systems. Review of: AMI, Angina, Valve disease, Heart Failure, Cardiac Arrhythmia, Pacemakers, and the ICD.   Heart Failure: - Group verbal and written instruction on the basics of heart failure: signs/symptoms, treatments, explanation of ejection fraction, enlarged heart and cardiomyopathy.   Sleep Apnea: - Individual verbal and written instruction to review Obstructive Sleep Apnea. Review of risk factors, methods for diagnosing and types of masks and machines for OSA.   Anxiety: - Provides group, verbal and written instruction on the correlation between heart/lung disease and anxiety, treatment options, and management of anxiety.   Relaxation: - Provides group, verbal and written instruction about the benefits of relaxation for patients with heart/lung disease. Also provides patients with examples of relaxation techniques.   Cardiac Medications: - Group verbal and written instruction to review commonly prescribed  medications for heart disease. Reviews the medication, class of the drug, and side effects.   Know Your Numbers: -Group verbal and written instruction about important numbers in your health.  Review of Cholesterol, Blood Pressure, Diabetes, and BMI and the role they play in your overall health.   Other: -Provides group and verbal instruction on various topics (see comments)    Knowledge Questionnaire Score: Knowledge Questionnaire Score - 10/03/17 1435      Knowledge Questionnaire Score   Pre Score  9/18        Core Components/Risk Factors/Patient Goals at Admission: Personal Goals and Risk Factors at Admission - 10/03/17 1452      Core Components/Risk Factors/Patient Goals on Admission    Weight Management  Weight Loss;Obesity;Yes    Intervention  Obesity: Provide education and appropriate resources to help participant work on and attain dietary goals.;Weight Management/Obesity: Establish reasonable short term and long term weight goals.;Weight Management: Provide education and appropriate resources to help participant work on and attain dietary goals.;Weight Management: Develop a combined nutrition and exercise program designed to reach desired caloric intake, while maintaining appropriate intake of nutrient and fiber, sodium and fats, and appropriate energy expenditure required for the weight goal.    Admit Weight  258 lb 4.8 oz (117.2 kg)    Goal Weight: Short Term  253 lb (114.8 kg)    Goal Weight: Long Term  200 lb (90.7 kg)    Expected Outcomes  Short Term: Continue to assess and modify interventions until short term weight is achieved;Long Term: Adherence to nutrition and physical activity/exercise program aimed toward attainment of established weight goal;Weight Loss: Understanding of general recommendations for a balanced deficit meal plan, which promotes 1-2 lb weight loss per week and includes a negative energy balance of [phone] kcal/d;Understanding of distribution of  calorie intake throughout the day with the consumption of 4-5 meals/snacks;Understanding recommendations for meals to include 15-35% energy as protein, 25-35% energy from fat, 35-60% energy from carbohydrates, less than 200mg  of dietary cholesterol, 20-35 gm of total fiber daily    Tobacco Cessation  Yes    Intervention  Assist the participant in steps to quit. Provide individualized education and counseling about committing to Tobacco Cessation, relapse prevention, and pharmacological support that can be provided by physician.;Advice worker, assist with locating and accessing local/national Quit Smoking programs, and support quit date choice.    Expected Outcomes  Short Term: Will demonstrate readiness to quit, by selecting a quit date.;Long Term: Complete abstinence from all tobacco products for at least 12 months from quit date.;Short Term: Will quit all tobacco product use, adhering to prevention of relapse plan.    Improve  shortness of breath with ADL's  Yes    Intervention  Provide education, individualized exercise plan and daily activity instruction to help decrease symptoms of SOB with activities of daily living.    Expected Outcomes  Short Term: Achieves a reduction of symptoms when performing activities of daily living.    Hypertension  Yes    Intervention  Provide education on lifestyle modifcations including regular physical activity/exercise, weight management, moderate sodium restriction and increased consumption of fresh fruit, vegetables, and low fat dairy, alcohol moderation, and smoking cessation.;Monitor prescription use compliance.    Expected Outcomes  Short Term: Continued assessment and intervention until BP is < 140/58mm HG in hypertensive participants. < 130/41mm HG in hypertensive participants with diabetes, heart failure or chronic kidney disease.;Long Term: Maintenance of blood pressure at goal levels.       Core Components/Risk Factors/Patient Goals Review:      Core Components/Risk Factors/Patient Goals at Discharge (Final Review):    ITP Comments: ITP Comments    Row Name 10/03/17 1358 10/03/17 1507         ITP Comments  Medical Evaluation completed. Chart sent for review and changes to Dr. Emily Filbert Director of [place]. Diagnosis can be found in [facility] encounter 10/03/17  Unable to obtain all of patients goals due to her having to leave for another appointment. Will finish obtaining goals when patient returns.         Comments: Initial ITP

## 2017-10-03 NOTE — Patient Instructions (Signed)
Patient Instructions  Patient Details  Name: Wendy Ramos MRN: [id number] Date of Birth: 1966-10-02 Referring Provider:  Kirke Shaggy, *  Below are the personal goals you chose as well as exercise and nutrition goals. Our goal is to help you keep on track towards obtaining and maintaining your goals. We will be discussing your progress on these goals with you throughout the program.  Initial Exercise Prescription: Initial Exercise Prescription - 10/03/17 1500      Date of Initial Exercise RX and Referring Provider   Date  10/03/17    Referring Provider  Lynder Parents MD      Treadmill   MPH  2    Grade  0.5    Minutes  15    METs  2.67      NuStep   Level  2    SPM  80    Minutes  15    METs  2.5      REL-XR   Level  1    Speed  50    Minutes  15    METs  2.5      Prescription Details   Frequency (times per week)  2 can only attend on Mon and Friday    Duration  Progress to 45 minutes of aerobic exercise without signs/symptoms of physical distress      Intensity   THRR 40-80% of Max Heartrate  108-148    Ratings of Perceived Exertion  11-15    Perceived Dyspnea  0-4      Progression   Progression  Continue to progress workloads to maintain intensity without signs/symptoms of physical distress.      Resistance Training   Training Prescription  Yes    Weight  3 lbs    Reps  10-15       Exercise Goals: Frequency: Be able to perform aerobic exercise three times per week working toward 3-5 days per week.  Intensity: Work with a perceived exertion of 11 (fairly light) - 15 (hard) as tolerated. Follow your new exercise prescription and watch for changes in prescription as you progress with the program. Changes will be reviewed with you when they are made.  Duration: You should be able to do 30 minutes of continuous aerobic exercise in addition to a 5 minute warm-up and a 5 minute cool-down routine.  Nutrition Goals: Your personal nutrition goals will be  established when you do your nutrition analysis with the dietician.  The following are nutrition guidelines to follow: Cholesterol < 200mg /day Sodium < 1500mg /day Fiber: Women over 50 yrs - 21 grams per day  Personal Goals: Personal Goals and Risk Factors at Admission - 10/03/17 1452      Core Components/Risk Factors/Patient Goals on Admission    Weight Management  Weight Loss;Obesity;Yes    Intervention  Obesity: Provide education and appropriate resources to help participant work on and attain dietary goals.;Weight Management/Obesity: Establish reasonable short term and long term weight goals.;Weight Management: Provide education and appropriate resources to help participant work on and attain dietary goals.;Weight Management: Develop a combined nutrition and exercise program designed to reach desired caloric intake, while maintaining appropriate intake of nutrient and fiber, sodium and fats, and appropriate energy expenditure required for the weight goal.    Admit Weight  258 lb 4.8 oz (117.2 kg)    Goal Weight: Short Term  253 lb (114.8 kg)    Goal Weight: Long Term  200 lb (90.7 kg)    Expected Outcomes  Short Term: Continue to assess and modify interventions until short term weight is achieved;Long Term: Adherence to nutrition and physical activity/exercise program aimed toward attainment of established weight goal;Weight Loss: Understanding of general recommendations for a balanced deficit meal plan, which promotes 1-2 lb weight loss per week and includes a negative energy balance of [phone] kcal/d;Understanding of distribution of calorie intake throughout the day with the consumption of 4-5 meals/snacks;Understanding recommendations for meals to include 15-35% energy as protein, 25-35% energy from fat, 35-60% energy from carbohydrates, less than 200mg  of dietary cholesterol, 20-35 gm of total fiber daily    Tobacco Cessation  Yes    Intervention  Assist the participant in steps to quit.  Provide individualized education and counseling about committing to Tobacco Cessation, relapse prevention, and pharmacological support that can be provided by physician.;Advice worker, assist with locating and accessing local/national Quit Smoking programs, and support quit date choice.    Expected Outcomes  Short Term: Will demonstrate readiness to quit, by selecting a quit date.;Long Term: Complete abstinence from all tobacco products for at least 12 months from quit date.;Short Term: Will quit all tobacco product use, adhering to prevention of relapse plan.    Improve shortness of breath with ADL's  Yes    Intervention  Provide education, individualized exercise plan and daily activity instruction to help decrease symptoms of SOB with activities of daily living.    Expected Outcomes  Short Term: Achieves a reduction of symptoms when performing activities of daily living.    Hypertension  Yes    Intervention  Provide education on lifestyle modifcations including regular physical activity/exercise, weight management, moderate sodium restriction and increased consumption of fresh fruit, vegetables, and low fat dairy, alcohol moderation, and smoking cessation.;Monitor prescription use compliance.    Expected Outcomes  Short Term: Continued assessment and intervention until BP is < 140/21mm HG in hypertensive participants. < 130/6mm HG in hypertensive participants with diabetes, heart failure or chronic kidney disease.;Long Term: Maintenance of blood pressure at goal levels.       Tobacco Use Initial Evaluation: Social History   Tobacco Use  Smoking Status Current Every Day Smoker  . Packs/day: 0.50  . Types: Cigarettes  Smokeless Tobacco Never Used  Tobacco Comment   states she smokes marijuana, Will give cessation materials upon return    Exercise Goals and Review: Exercise Goals    Row Name 10/03/17 1540             Exercise Goals   Increase Physical Activity  Yes        Intervention  Provide advice, education, support and counseling about physical activity/exercise needs.;Develop an individualized exercise prescription for aerobic and resistive training based on initial evaluation findings, risk stratification, comorbidities and participant's personal goals.       Expected Outcomes  Achievement of increased cardiorespiratory fitness and enhanced flexibility, muscular endurance and strength shown through measurements of functional capacity and personal statement of participant.       Increase Strength and Stamina  Yes       Intervention  Provide advice, education, support and counseling about physical activity/exercise needs.;Develop an individualized exercise prescription for aerobic and resistive training based on initial evaluation findings, risk stratification, comorbidities and participant's personal goals.       Expected Outcomes  Achievement of increased cardiorespiratory fitness and enhanced flexibility, muscular endurance and strength shown through measurements of functional capacity and personal statement of participant.       Able to understand and use rate  of perceived exertion (RPE) scale  Yes       Intervention  Provide education and explanation on how to use RPE scale       Expected Outcomes  Short Term: Able to use RPE daily in rehab to express subjective intensity level;Long Term:  Able to use RPE to guide intensity level when exercising independently       Able to understand and use Dyspnea scale  Yes       Intervention  Provide education and explanation on how to use Dyspnea scale       Expected Outcomes  Short Term: Able to use Dyspnea scale daily in rehab to express subjective sense of shortness of breath during exertion;Long Term: Able to use Dyspnea scale to guide intensity level when exercising independently       Knowledge and understanding of Target Heart Rate Range (THRR)  Yes       Intervention  Provide education and explanation of THRR  including how the numbers were predicted and where they are located for reference       Expected Outcomes  Short Term: Able to state/look up THRR;Long Term: Able to use THRR to govern intensity when exercising independently;Short Term: Able to use daily as guideline for intensity in rehab       Able to check pulse independently  Yes       Intervention  Provide education and demonstration on how to check pulse in carotid and radial arteries.;Review the importance of being able to check your own pulse for safety during independent exercise       Expected Outcomes  Short Term: Able to explain why pulse checking is important during independent exercise;Long Term: Able to check pulse independently and accurately       Understanding of Exercise Prescription  Yes       Intervention  Provide education, explanation, and written materials on patient's individual exercise prescription       Expected Outcomes  Long Term: Able to explain home exercise prescription to exercise independently;Short Term: Able to explain program exercise prescription          Copy of goals given to participant.

## 2017-10-05 ENCOUNTER — Ambulatory Visit: Payer: Medicaid Other | Admitting: Physical Therapy

## 2017-10-14 ENCOUNTER — Telehealth: Payer: Self-pay

## 2017-10-14 DIAGNOSIS — J4521 Mild intermittent asthma with (acute) exacerbation: Secondary | ICD-10-CM

## 2017-10-14 NOTE — Telephone Encounter (Signed)
Wendy Ramos states she is not feeling well today and wants to meet with her doctor. She states she will try to start the first of the year.

## 2017-10-21 ENCOUNTER — Encounter: Payer: Medicaid Other | Attending: Internal Medicine

## 2017-10-21 DIAGNOSIS — J4521 Mild intermittent asthma with (acute) exacerbation: Secondary | ICD-10-CM

## 2017-10-21 DIAGNOSIS — J45909 Unspecified asthma, uncomplicated: Secondary | ICD-10-CM | POA: Diagnosis not present

## 2017-10-21 NOTE — Progress Notes (Signed)
Daily Session Note  Patient Details  Name: Wendy Ramos MRN: [id number] Date of Birth: 10-01-1966 Referring Provider:     Pulmonary Rehab from 10/03/2017 in [facility]  Referring Provider  Lynder Parents MD      Encounter Date: 10/21/2017  Check In: Session Check In - 10/21/17 1018      Check-In   Location  [facility]-Cardiac & Pulmonary Rehab    Staff Present  Justin Mend Lindell Spar, BA, ACSM CEP, Exercise Physiologist;Meredith Sherryll Burger, RN BSN    Supervising physician immediately available to respond to emergencies  [facility] immediately available ER MD    Physician(s)  Dr. Corky Downs and Clearnce Hasten    Medication changes reported      No    Fall or balance concerns reported     No    Tobacco Cessation  Use Decreased Eryanna states she is down to 2 cigarettes a day    Warm-up and Cool-down  Performed as group-led instruction    Resistance Training Performed  Yes    VAD Patient?  No      Pain Assessment   Currently in Pain?  No/denies          Social History   Tobacco Use  Smoking Status Current Every Day Smoker  . Packs/day: 0.50  . Types: Cigarettes  Smokeless Tobacco Never Used  Tobacco Comment   states she smokes marijuana, Will give cessation materials upon return    Goals Met:  Using PLB without cueing & demonstrates good technique Exercise tolerated well No report of cardiac concerns or symptoms Strength training completed today  Goals Unmet:  Not Applicable  Comments: First full day of exercise!  Patient was oriented to gym and equipment including functions, settings, policies, and procedures.  Patient's individual exercise prescription and treatment plan were reviewed.  All starting workloads were established based on the results of the 6 minute walk test done at initial orientation visit.  The plan for exercise progression was also introduced and progression will be customized based on patient's performance and goals.   Dr. Emily Filbert is Medical Director for [place] and [facility].

## 2017-10-26 DIAGNOSIS — J4521 Mild intermittent asthma with (acute) exacerbation: Secondary | ICD-10-CM

## 2017-10-26 DIAGNOSIS — J45909 Unspecified asthma, uncomplicated: Secondary | ICD-10-CM | POA: Diagnosis not present

## 2017-10-26 NOTE — Progress Notes (Signed)
Daily Session Note  Patient Details  Name: Wendy Ramos MRN: [id number] Date of Birth: 07/04/1966 Referring Provider:     Pulmonary Rehab from 10/03/2017 in [facility] Cardiac and Pulmonary Rehab  Referring Provider  Hemming, John MD      Encounter Date: 10/26/2017  Check In: Session Check In - 10/26/17 1013      Check-In   Location  [facility] Pul[facility] Re[facility]    Staff Present    RCP,RRT,BSRT;Jessica Hawkins, MA, ACSM RCEP, Exercise Physiologist;Amanda Sommer, BA, ACSM CEP, Exercise Physiologist    Supervising physician immediately available to respond to emergencies  [facility] immediately available ER MD    Physician(s)  Dr. Robinson and Williams    Medication changes reported      No    Fall or balance concerns reported     No    Tobacco Cessation  No Change    Warm-up and Cool-down  Performed as group-led instruction    Resistance Training Performed  Yes    VAD Patient?  No      Pain Assessment   Currently in Pain?  No/denies          Social History   Tobacco Use  Smoking Status Current Every Day Smoker  . Packs/day: 0.50  . Types: Cigarettes  Smokeless Tobacco Never Used  Tobacco Comment   states she smokes marijuana, Will give cessation materials upon return    Goals Met:  Independence with exercise equipment Exercise tolerated well No report of cardiac concerns or symptoms Strength training completed today  Goals Unmet:  Not Applicable  Comments: Pt able to follow exercise prescription today without complaint.  Will continue to monitor for progression.   Dr. Mark Miller is Medical Director for [facility] and [facility]. 

## 2017-10-27 ENCOUNTER — Emergency Department
Admission: EM | Admit: 2017-10-27 | Discharge: 2017-10-27 | Disposition: A | Discharge status: Home / Self Care | Payer: Medicaid Other | Attending: Emergency Medicine | Admitting: Emergency Medicine

## 2017-10-27 ENCOUNTER — Encounter: Payer: Self-pay | Admitting: Emergency Medicine

## 2017-10-27 ENCOUNTER — Emergency Department: Payer: Medicaid Other

## 2017-10-27 ENCOUNTER — Other Ambulatory Visit: Payer: Self-pay

## 2017-10-27 DIAGNOSIS — Z5321 Procedure and treatment not carried out due to patient leaving prior to being seen by health care provider: Secondary | ICD-10-CM | POA: Insufficient documentation

## 2017-10-27 DIAGNOSIS — R0602 Shortness of breath: Secondary | ICD-10-CM | POA: Insufficient documentation

## 2017-10-27 LAB — CBC
HCT: 35.6 % (ref 35.0–47.0)
Hemoglobin: 11.8 g/dL — ABNORMAL LOW (ref 12.0–16.0)
MCH: 31.2 pg (ref 26.0–34.0)
MCHC: 33.1 g/dL (ref 32.0–36.0)
MCV: 94.3 fL (ref 80.0–100.0)
PLATELETS: 242 10*3/uL (ref 150–440)
RBC: 3.78 MIL/uL — ABNORMAL LOW (ref 3.80–5.20)
RDW: 13.9 % (ref 11.5–14.5)
WBC: 4.6 10*3/uL (ref 3.6–11.0)

## 2017-10-27 LAB — BASIC METABOLIC PANEL
Anion gap: 9 (ref 5–15)
BUN: 13 mg/dL (ref 6–20)
CHLORIDE: 105 mmol/L (ref 101–111)
CO2: 26 mmol/L (ref 22–32)
CREATININE: 0.9 mg/dL (ref 0.44–1.00)
Calcium: 9.2 mg/dL (ref 8.9–10.3)
GFR calc Af Amer: >60 mL/min (ref >60)
GFR calc non Af Amer: >60 mL/min (ref >60)
Glucose, Bld: 96 mg/dL (ref 65–99)
Potassium: 3.7 mmol/L (ref 3.5–5.1)
SODIUM: 140 mmol/L (ref 135–145)

## 2017-10-27 LAB — TROPONIN I

## 2017-10-27 NOTE — ED Notes (Signed)
Patient left facility AMA and refused to sign any paperwork at this time.  Patient left in NAD with even and unlabored respirations and good gait with ambulation.  Patient also refused to be registered with staff and informed that she will receive her bill instead of her Medicaid.  Patient still continued to leave without signing anything or willing to speak with this RN in regards to staying.

## 2017-10-27 NOTE — ED Triage Notes (Signed)
Pt reports right side arm and side pain and pain upon deep breath. Pt reports SOB. Pt reports history of COPD. Ambulatory to triage, no apparent distress noted.

## 2017-10-28 ENCOUNTER — Telehealth: Payer: Self-pay | Admitting: Emergency Medicine

## 2017-10-28 NOTE — Telephone Encounter (Signed)
Called patient due to lwot to inquire about condition and follow up plans. Says she is going to her [facility] doctor now.  I asked her to let her doctor know that tests were done here and he needs to review.  She agrees.

## 2017-10-31 DIAGNOSIS — J4521 Mild intermittent asthma with (acute) exacerbation: Secondary | ICD-10-CM

## 2017-10-31 NOTE — Progress Notes (Signed)
Pulmonary Individual Treatment Plan  Patient Details  Name: Kevin Mario MRN: [id number] Date of Birth: Mar 08, 1966 Referring Provider:     Pulmonary Rehab from 10/03/2017 in [facility] Cardiac and Pulmonary Rehab  Referring Provider  Lynder Parents MD      Initial Encounter Date:    Pulmonary Rehab from 10/03/2017 in [facility] Cardiac and Pulmonary Rehab  Date  10/03/17  Referring Provider  Lynder Parents MD      Visit Diagnosis: Mild intermittent asthma with (acute) exacerbation  Patient's Home Medications on Admission:  Current Outpatient Medications:  .  amLODipine (NORVASC) 10 MG tablet, Take 10 mg by mouth daily., Disp: , Rfl: 3 .  benzonatate (TESSALON) 200 MG capsule, Take 1 capsule (200 mg total) by mouth 3 (three) times daily., Disp: 20 capsule, Rfl: 0 .  buPROPion (WELLBUTRIN SR) 100 MG 12 hr tablet, Take 100 mg by mouth 2 (two) times daily., Disp: , Rfl: 3 .  cetirizine (ZYRTEC) 5 MG tablet, Take 5 mg by mouth daily., Disp: , Rfl: 3 .  famotidine (PEPCID) 20 MG tablet, Take 20 mg by mouth 2 (two) times daily. , Disp: , Rfl:  .  hydrochlorothiazide (HYDRODIURIL) 25 MG tablet, Take 25 mg by mouth daily., Disp: , Rfl: 11 .  ipratropium-albuterol (DUONEB) 0.5-2.5 (3) MG/3ML SOLN, Take 3 mLs by nebulization every 6 (six) hours as needed (sob wheezing)., Disp: 360 mL, Rfl: 0 .  meloxicam (MOBIC) 15 MG tablet, Take 15 mg by mouth daily., Disp: , Rfl: 3 .  methocarbamol (ROBAXIN) 500 MG tablet, Take 2 tablets (1,000 mg total) by mouth 4 (four) times daily as needed for muscle spasms (muscle spasm/pain). (Patient not taking: Reported on 09/10/2017), Disp: 25 tablet, Rfl: 0 .  mometasone-formoterol (DULERA) 200-5 MCG/ACT AERO, Inhale 2 puffs into the lungs 2 (two) times daily., Disp: 1 Inhaler, Rfl: 0 .  nicotine polacrilex (NICORETTE) 2 MG gum, Take 2 mg by mouth as needed for smoking cessation., Disp: , Rfl:  .  omeprazole (PRILOSEC) 40 MG capsule, Take 40 mg by mouth 2 (two) times daily., Disp:  , Rfl:  .  potassium chloride (K-DUR) 10 MEQ tablet, 10 mEq 2 (two) times daily., Disp: , Rfl: 11 .  PROAIR HFA 108 (90 Base) MCG/ACT inhaler, Inhale 2 puffs into the lungs every 6 (six) hours as needed for wheezing., Disp: , Rfl: 1  Past Medical History: Past Medical History:  Diagnosis Date  . Asthma   . Cancer [facility])    kidney cancer- 2014;   . COPD (chronic obstructive pulmonary disease) (HCC)     Tobacco Use: Social History   Tobacco Use  Smoking Status Current Every Day Smoker  . Packs/day: 0.50  . Types: Cigarettes  Smokeless Tobacco Never Used  Tobacco Comment   states she smokes marijuana, Will give cessation materials upon return    Labs: Recent Review Flowsheet Data    There is no flowsheet data to display.       Pulmonary Assessment Scores: Pulmonary Assessment Scores    Row Name 10/03/17 1432         ADL UCSD   ADL Phase  Entry     SOB Score total  55     Rest  3     Walk  4     Stairs  4     Bath  2     Dress  2     Shop  1       CAT Score   CAT  Score  24       mMRC Score   mMRC Score  1        Pulmonary Function Assessment: Pulmonary Function Assessment - 10/03/17 1456      Breath   Shortness of Breath  Limiting activity;Yes       Exercise Target Goals:    Exercise Program Goal: Individual exercise prescription set with THRR, safety & activity barriers. Participant demonstrates ability to understand and report RPE using BORG scale, to self-measure pulse accurately, and to acknowledge the importance of the exercise prescription.  Exercise Prescription Goal: Starting with aerobic activity 30 plus minutes a day, 3 days per week for initial exercise prescription. Provide home exercise prescription and guidelines that participant acknowledges understanding prior to discharge.  Activity Barriers & Risk Stratification: Activity Barriers & Cardiac Risk Stratification - 10/03/17 1538      Activity Barriers & Cardiac Risk Stratification     Activity Barriers  Back Problems;Neck/Spine Problems;Joint Problems;Arthritis;Deconditioning;Muscular Weakness;Shortness of Breath DJD, knees give out on occasion       6 Minute Walk: 6 Minute Walk    Row Name 10/03/17 1536         6 Minute Walk   Phase  Initial     Distance  1235 feet     Walk Time  6 minutes     # of Rest Breaks  0     MPH  2.34     METS  2.53     RPE  11     Perceived Dyspnea   1     VO2 Peak  8.86     Symptoms  Yes (comment)     Comments  knee pain 4/10 (chronic)     Resting HR  68 bpm     Resting BP  124/70     Resting Oxygen Saturation   100 %     Exercise Oxygen Saturation  during 6 min walk  88 %     Max Ex. HR  79 bpm     Max Ex. BP  126/70     2 Minute Post BP  128/70       Interval HR   1 Minute HR  78     2 Minute HR  78     3 Minute HR  77     6 Minute HR  79     2 Minute Post HR  70     Interval Heart Rate?  Yes       Interval Oxygen   Interval Oxygen?  Yes     Baseline Oxygen Saturation %  100 %     1 Minute Oxygen Saturation %  94 %     1 Minute Liters of Oxygen  0 L Room Air     2 Minute Oxygen Saturation %  88 %     2 Minute Liters of Oxygen  0 L     3 Minute Oxygen Saturation %  88 %     3 Minute Liters of Oxygen  0 L     4 Minute Oxygen Saturation %  93 %     4 Minute Liters of Oxygen  0 L     5 Minute Oxygen Saturation %  92 %     5 Minute Liters of Oxygen  0 L     6 Minute Oxygen Saturation %  95 %     6 Minute Liters of Oxygen  0 L  2 Minute Post Oxygen Saturation %  100 %     2 Minute Post Liters of Oxygen  0 L       Oxygen Initial Assessment: Oxygen Initial Assessment - 10/03/17 1454      Home Oxygen   Home Oxygen Device  None    Sleep Oxygen Prescription  None    Home Exercise Oxygen Prescription  None    Home at Rest Exercise Oxygen Prescription  None      Initial 6 min Walk   Oxygen Used  None      Program Oxygen Prescription   Program Oxygen Prescription  None      Intervention   Short Term  Goals  To learn and demonstrate proper use of respiratory medications;To learn and understand importance of maintaining oxygen saturations>88%;To learn and demonstrate proper pursed lip breathing techniques or other breathing techniques.;To learn and understand importance of monitoring SPO2 with pulse oximeter and demonstrate accurate use of the pulse oximeter.    Long  Term Goals  Verbalizes importance of monitoring SPO2 with pulse oximeter and return demonstration;Maintenance of O2 saturations>88%;Compliance with respiratory medication;Demonstrates proper use of MDI's;Exhibits proper breathing techniques, such as pursed lip breathing or other method taught during program session       Oxygen Re-Evaluation: Oxygen Re-Evaluation    Row Name 10/21/17 1214             Program Oxygen Prescription   Program Oxygen Prescription  None         Home Oxygen   Home Oxygen Device  None       Sleep Oxygen Prescription  None       Home Exercise Oxygen Prescription  None       Home at Rest Exercise Oxygen Prescription  None         Goals/Expected Outcomes   Short Term Goals  To learn and demonstrate proper use of respiratory medications;To learn and understand importance of maintaining oxygen saturations>88%;To learn and demonstrate proper pursed lip breathing techniques or other breathing techniques.;To learn and understand importance of monitoring SPO2 with pulse oximeter and demonstrate accurate use of the pulse oximeter.       Long  Term Goals  Verbalizes importance of monitoring SPO2 with pulse oximeter and return demonstration;Maintenance of O2 saturations>88%;Compliance with respiratory medication;Demonstrates proper use of MDI's;Exhibits proper breathing techniques, such as pursed lip breathing or other method taught during program session       Comments  Reviewed PLB technique with pt.  Talked about how it work and it's important to maintaining his exercise saturations.         Goals/Expected  Outcomes  Short: Become more profiecient at using PLB.   Long: Become independent at using PLB.          Oxygen Discharge (Final Oxygen Re-Evaluation): Oxygen Re-Evaluation - 10/21/17 1214      Program Oxygen Prescription   Program Oxygen Prescription  None      Home Oxygen   Home Oxygen Device  None    Sleep Oxygen Prescription  None    Home Exercise Oxygen Prescription  None    Home at Rest Exercise Oxygen Prescription  None      Goals/Expected Outcomes   Short Term Goals  To learn and demonstrate proper use of respiratory medications;To learn and understand importance of maintaining oxygen saturations>88%;To learn and demonstrate proper pursed lip breathing techniques or other breathing techniques.;To learn and understand importance of monitoring SPO2 with pulse oximeter and demonstrate  accurate use of the pulse oximeter.    Long  Term Goals  Verbalizes importance of monitoring SPO2 with pulse oximeter and return demonstration;Maintenance of O2 saturations>88%;Compliance with respiratory medication;Demonstrates proper use of MDI's;Exhibits proper breathing techniques, such as pursed lip breathing or other method taught during program session    Comments  Reviewed PLB technique with pt.  Talked about how it work and it's important to maintaining his exercise saturations.      Goals/Expected Outcomes  Short: Become more profiecient at using PLB.   Long: Become independent at using PLB.       Initial Exercise Prescription: Initial Exercise Prescription - 10/03/17 1500      Date of Initial Exercise RX and Referring Provider   Date  10/03/17    Referring Provider  Lynder Parents MD      Treadmill   MPH  2    Grade  0.5    Minutes  15    METs  2.67      NuStep   Level  2    SPM  80    Minutes  15    METs  2.5      REL-XR   Level  1    Speed  50    Minutes  15    METs  2.5      Prescription Details   Frequency (times per week)  2 can only attend on Mon and Friday     Duration  Progress to 45 minutes of aerobic exercise without signs/symptoms of physical distress      Intensity   THRR 40-80% of Max Heartrate  108-148    Ratings of Perceived Exertion  11-15    Perceived Dyspnea  0-4      Progression   Progression  Continue to progress workloads to maintain intensity without signs/symptoms of physical distress.      Resistance Training   Training Prescription  Yes    Weight  3 lbs    Reps  10-15       Perform Capillary Blood Glucose checks as needed.  Exercise Prescription Changes: Exercise Prescription Changes    Row Name 10/03/17 1500             Response to Exercise   Blood Pressure (Admit)  124/70       Blood Pressure (Exercise)  126/70       Blood Pressure (Exit)  128/70       Heart Rate (Admit)  68 bpm       Heart Rate (Exercise)  79 bpm       Heart Rate (Exit)  70 bpm       Oxygen Saturation (Admit)  100 %       Oxygen Saturation (Exercise)  88 %       Oxygen Saturation (Exit)  100 %       Rating of Perceived Exertion (Exercise)  11       Perceived Dyspnea (Exercise)  1       Symptoms  knee pain 4/10       Comments  walk test results          Exercise Comments: Exercise Comments    Row Name 10/21/17 1020           Exercise Comments  First full day of exercise!  Patient was oriented to gym and equipment including functions, settings, policies, and procedures.  Patient's individual exercise prescription and treatment plan were reviewed.  All starting workloads  were established based on the results of the 6 minute walk test done at initial orientation visit.  The plan for exercise progression was also introduced and progression will be customized based on patient's performance and goals.          Exercise Goals and Review: Exercise Goals    Row Name 10/03/17 1540             Exercise Goals   Increase Physical Activity  Yes       Intervention  Provide advice, education, support and counseling about physical  activity/exercise needs.;Develop an individualized exercise prescription for aerobic and resistive training based on initial evaluation findings, risk stratification, comorbidities and participant's personal goals.       Expected Outcomes  Achievement of increased cardiorespiratory fitness and enhanced flexibility, muscular endurance and strength shown through measurements of functional capacity and personal statement of participant.       Increase Strength and Stamina  Yes       Intervention  Provide advice, education, support and counseling about physical activity/exercise needs.;Develop an individualized exercise prescription for aerobic and resistive training based on initial evaluation findings, risk stratification, comorbidities and participant's personal goals.       Expected Outcomes  Achievement of increased cardiorespiratory fitness and enhanced flexibility, muscular endurance and strength shown through measurements of functional capacity and personal statement of participant.       Able to understand and use rate of perceived exertion (RPE) scale  Yes       Intervention  Provide education and explanation on how to use RPE scale       Expected Outcomes  Short Term: Able to use RPE daily in rehab to express subjective intensity level;Long Term:  Able to use RPE to guide intensity level when exercising independently       Able to understand and use Dyspnea scale  Yes       Intervention  Provide education and explanation on how to use Dyspnea scale       Expected Outcomes  Short Term: Able to use Dyspnea scale daily in rehab to express subjective sense of shortness of breath during exertion;Long Term: Able to use Dyspnea scale to guide intensity level when exercising independently       Knowledge and understanding of Target Heart Rate Range (THRR)  Yes       Intervention  Provide education and explanation of THRR including how the numbers were predicted and where they are located for reference        Expected Outcomes  Short Term: Able to state/look up THRR;Long Term: Able to use THRR to govern intensity when exercising independently;Short Term: Able to use daily as guideline for intensity in rehab       Able to check pulse independently  Yes       Intervention  Provide education and demonstration on how to check pulse in carotid and radial arteries.;Review the importance of being able to check your own pulse for safety during independent exercise       Expected Outcomes  Short Term: Able to explain why pulse checking is important during independent exercise;Long Term: Able to check pulse independently and accurately       Understanding of Exercise Prescription  Yes       Intervention  Provide education, explanation, and written materials on patient's individual exercise prescription       Expected Outcomes  Long Term: Able to explain home exercise prescription to exercise independently;Short Term: Able to  explain program exercise prescription          Exercise Goals Re-Evaluation : Exercise Goals Re-Evaluation    [place] Name 10/21/17 1020             Exercise Goal Re-Evaluation   Exercise Goals Review  Understanding of Exercise Prescription;Knowledge and understanding of Target Heart Rate Range (THRR);Able to understand and use rate of perceived exertion (RPE) scale;Able to understand and use Dyspnea scale       Comments  Reviewed RPE scale, THR and program prescription with pt today.  Pt voiced understanding and was given a copy of goals to take home.        Expected Outcomes  Short: Use RPE daily to regulate intensity.  Long: Follow program prescription in THR.          Discharge Exercise Prescription (Final Exercise Prescription Changes): Exercise Prescription Changes - 10/03/17 1500      Response to Exercise   Blood Pressure (Admit)  124/70    Blood Pressure (Exercise)  126/70    Blood Pressure (Exit)  128/70    Heart Rate (Admit)  68 bpm    Heart Rate (Exercise)  79 bpm     Heart Rate (Exit)  70 bpm    Oxygen Saturation (Admit)  100 %    Oxygen Saturation (Exercise)  88 %    Oxygen Saturation (Exit)  100 %    Rating of Perceived Exertion (Exercise)  11    Perceived Dyspnea (Exercise)  1    Symptoms  knee pain 4/10    Comments  walk test results       Nutrition:  Target Goals: Understanding of nutrition guidelines, daily intake of sodium <1521m, cholesterol <2083m calories 30% from fat and 7% or less from saturated fats, daily to have 5 or more servings of fruits and vegetables.  Biometrics: Pre Biometrics - 10/03/17 1541      Pre Biometrics   Height  5' 4.8" (1.646 m)    Weight  258 lb 4.8 oz (117.2 kg)    Waist Circumference  43 inches    Hip Circumference  55 inches    Waist to Hip Ratio  0.78 %    BMI (Calculated)  43.24        Nutrition Therapy Plan and Nutrition Goals: Nutrition Therapy & Goals - 10/03/17 1443      Intervention Plan   Intervention  Prescribe, educate and counsel regarding individualized specific dietary modifications aiming towards targeted core components such as weight, hypertension, lipid management, diabetes, heart failure and other comorbidities.;Nutrition handout(s) given to patient.    Expected Outcomes  Short Term Goal: Understand basic principles of dietary content, such as calories, fat, sodium, cholesterol and nutrients.;Long Term Goal: Adherence to prescribed nutrition plan.       Nutrition Discharge: Rate Your Plate Scores:   Nutrition Goals Re-Evaluation:   Nutrition Goals Discharge (Final Nutrition Goals Re-Evaluation):   Psychosocial: Target Goals: Acknowledge presence or absence of significant depression and/or stress, maximize coping skills, provide positive support system. Participant is able to verbalize types and ability to use techniques and skills needed for reducing stress and depression.   Initial Review & Psychosocial Screening: Initial Psych Review & Screening - 10/03/17 1456       Initial Review   Current issues with  Current Depression;Current Stress Concerns    Source of Stress Concerns  Chronic Illness;Retirement/disability;Unable to perform yard/household activities;Unable to participate in former interests or hobbies    Comments  Based  off PHQ9 scores      Barriers   Psychosocial barriers to participate in program  The patient should benefit from training in stress management and relaxation.      Screening Interventions   Interventions  Yes;Encouraged to exercise;Program counselor consult;Provide feedback about the scores to participant;To provide support and resources with identified psychosocial needs;Other (comment)    Comments  patient would like to have treatment    Expected Outcomes  Short Term goal: Utilizing psychosocial counselor, staff and physician to assist with identification of specific Stressors or current issues interfering with healing process. Setting desired goal for each stressor or current issue identified.;Long Term Goal: Stressors or current issues are controlled or eliminated.;Short Term goal: Identification and review with participant of any Quality of Life or Depression concerns found by scoring the questionnaire.;Long Term goal: The participant improves quality of Life and PHQ9 Scores as seen by post scores and/or verbalization of changes       Quality of Life Scores:   PHQ-9: Recent Review Flowsheet Data    Depression screen [facility] 2/9 10/03/2017   Decreased Interest 3   Down, Depressed, Hopeless 3   PHQ - 2 Score 6   Altered sleeping 3   Tired, decreased energy 3   Change in appetite 3   Feeling bad or failure about yourself  0   Trouble concentrating 2   Moving slowly or fidgety/restless 3   Suicidal thoughts 1    PHQ-9 Score 21   Difficult doing work/chores Extremely dIfficult     Interpretation of Total Score  Total Score Depression Severity:  1-4 = Minimal depression, 5-9 = Mild depression, 10-14 = Moderate depression,  15-19 = Moderately severe depression, 20-27 = Severe depression   Psychosocial Evaluation and Intervention: Psychosocial Evaluation - 10/26/17 1145      Psychosocial Evaluation & Interventions   Interventions  Therapist referral;Stress management education;Relaxation education;Encouraged to exercise with the program and follow exercise prescription    Comments  Counselor met with Ms. Sherral Hammers Otila Kluver) for initial psychosocial evaluation.  She will be 52 years old in a few weeks and was diagnosed this past year with COPD. Sheilla lives with her grandson and has a close friend who is very supportive of her.  She has multiple health issues being a kidney cancer survivor. of 3 years.  She has GERD; dermatological concerns currently and has sleep apnea that has returned subsequent to her weight gain over the past year.  Noemi states she only sleeps maybe 3-4 hours/night.  Counselor encouraged her to call her Dr. about this.  She reports a history of depression and anxiety and was prescribed medications for this yesterday to begin soon.  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  Sharice has multiple stressors in her life currently and over the past year with a move to this community this past fall; a failed business venture resulting financial issues; loss of disability income as a result of business venture and conflict in her family system with her children and a sister.  Counselor provided Gracilyn with mental health resources for a counselor/group to provide supportive services to her during this time.  She has goals to breathe better; lose some weight and be able to walk outside again in the near future.  Counselor and staff will follow with Giovanna throughout the course of this program.      Expected Outcomes  Keairra will benefit from consistent exercise to achieve her stated goals.  She will contact her Dr.  about her returned Sleep Apnea and will contact a mental health provider for supportive services.  Maren  will also benefit from the educational and psychoeducational components of this program to learn more about her condition and more positive coping strategies.  Counselor will follow with her.     Continue Psychosocial Services   Follow up required by counselor       Psychosocial Re-Evaluation:   Psychosocial Discharge (Final Psychosocial Re-Evaluation):   Education: Education Goals: Education classes will be provided on a weekly basis, covering required topics. Participant will state understanding/return demonstration of topics presented.  Learning Barriers/Preferences: Learning Barriers/Preferences - 10/03/17 1455      Learning Barriers/Preferences   Learning Barriers  Sight wears glasses    Learning Preferences  None       Education Topics: Initial Evaluation Education: - Verbal, written and demonstration of respiratory meds, RPE/PD scales, oximetry and breathing techniques. Instruction on use of nebulizers and MDIs: cleaning and proper use, rinsing mouth with steroid doses and importance of monitoring MDI activations.   Pulmonary Rehab from 10/26/2017 in [facility] Cardiac and Pulmonary Rehab  Date  10/03/17  Educator  [facility]  Instruction Review Code  1- Verbalizes Understanding      General Nutrition Guidelines/Fats and Fiber: -Group instruction provided by verbal, written material, models and posters to present the general guidelines for heart healthy nutrition. Gives an explanation and review of dietary fats and fiber.   Controlling Sodium/Reading Food Labels: -Group verbal and written material supporting the discussion of sodium use in heart healthy nutrition. Review and explanation with models, verbal and written materials for utilization of the food label.   Exercise Physiology & Risk Factors: - Group verbal and written instruction with models to review the exercise physiology of the cardiovascular system and associated critical values. Details cardiovascular disease risk  factors and the goals associated with each risk factor.   Pulmonary Rehab from 10/26/2017 in [facility] Cardiac and Pulmonary Rehab  Date  10/21/17  Educator  [facility]  Instruction Review Code  1- Verbalizes Understanding      Aerobic Exercise & Resistance Training: - Gives group verbal and written discussion on the health impact of inactivity. On the components of aerobic and resistive training programs and the benefits of this training and how to safely progress through these programs.   Flexibility, Balance, General Exercise Guidelines: - Provides group verbal and written instruction on the benefits of flexibility and balance training programs. Provides general exercise guidelines with specific guidelines to those with heart or lung disease. Demonstration and skill practice provided.   Stress Management: - Provides group verbal and written instruction about the health risks of elevated stress, cause of high stress, and healthy ways to reduce stress.   Depression: - Provides group verbal and written instruction on the correlation between heart/lung disease and depressed mood, treatment options, and the stigmas associated with seeking treatment.   Exercise & Equipment Safety: - Individual verbal instruction and demonstration of equipment use and safety with use of the equipment.   Pulmonary Rehab from 10/26/2017 in [facility] Cardiac and Pulmonary Rehab  Date  10/03/17  Educator  [facility]  Instruction Review Code  1- Verbalizes Understanding      Infection Prevention: - Provides verbal and written material to individual with discussion of infection control including proper hand washing and proper equipment cleaning during exercise session.   Pulmonary Rehab from 10/26/2017 in [facility] Cardiac and Pulmonary Rehab  Date  10/03/17  Educator  [facility]  Instruction Review Code  1- Verbalizes Understanding      Falls Prevention: - Provides verbal and written material to individual with discussion of falls prevention  and safety.   Pulmonary Rehab from 10/26/2017 in [facility] Cardiac and Pulmonary Rehab  Date  10/03/17  Educator  [facility]  Instruction Review Code  1- Verbalizes Understanding      Diabetes: - Individual verbal and written instruction to review signs/symptoms of diabetes, desired ranges of glucose level fasting, after meals and with exercise. Advice that pre and post exercise glucose checks will be done for 3 sessions at entry of program.   Chronic Lung Diseases: - Group verbal and written instruction to review new updates, new respiratory medications, new advancements in procedures and treatments. Provide informative websites and "800" numbers of self-education.   Lung Procedures: - Group verbal and written instruction to describe testing methods done to diagnose lung disease. Review the outcome of test results. Describe the treatment choices: Pulmonary Function Tests, ABGs and oximetry.   Energy Conservation: - Provide group verbal and written instruction for methods to conserve energy, plan and organize activities. Instruct on pacing techniques, use of adaptive equipment and posture/positioning to relieve shortness of breath.   Triggers: - Group verbal and written instruction to review types of environmental controls: home humidity, furnaces, filters, dust mite/pet prevention, HEPA vacuums. To discuss weather changes, air quality and the benefits of nasal washing.   Exacerbations: - Group verbal and written instruction to provide: warning signs, infection symptoms, calling MD promptly, preventive modes, and value of vaccinations. Review: effective airway clearance, coughing and/or vibration techniques. Create an Sports administrator.   Oxygen: - Individual and group verbal and written instruction on oxygen therapy. Includes supplement oxygen, available portable oxygen systems, continuous and intermittent flow rates, oxygen safety, concentrators, and Medicare reimbursement for oxygen.   Respiratory  Medications: - Group verbal and written instruction to review medications for lung disease. Drug class, frequency, complications, importance of spacers, rinsing mouth after steroid MDI's, and proper cleaning methods for nebulizers.   AED/CPR: - Group verbal and written instruction with the use of models to demonstrate the basic use of the AED with the basic ABC's of resuscitation.   Breathing Retraining: - Provides individuals verbal and written instruction on purpose, frequency, and proper technique of diaphragmatic breathing and pursed-lipped breathing. Applies individual practice skills.   Pulmonary Rehab from 10/26/2017 in [facility] Cardiac and Pulmonary Rehab  Date  10/03/17  Educator  [facility]  Instruction Review Code  1- Verbalizes Understanding      Anatomy and Physiology of the Lungs: - Group verbal and written instruction with the use of models to provide basic lung anatomy and physiology related to function, structure and complications of lung disease.   Anatomy & Physiology of the Heart: - Group verbal and written instruction and models provide basic cardiac anatomy and physiology, with the coronary electrical and arterial systems. Review of: AMI, Angina, Valve disease, Heart Failure, Cardiac Arrhythmia, Pacemakers, and the ICD.   Heart Failure: - Group verbal and written instruction on the basics of heart failure: signs/symptoms, treatments, explanation of ejection fraction, enlarged heart and cardiomyopathy.   Sleep Apnea: - Individual verbal and written instruction to review Obstructive Sleep Apnea. Review of risk factors, methods for diagnosing and types of masks and machines for OSA.   Anxiety: - Provides group, verbal and written instruction on the correlation between heart/lung disease and anxiety, treatment options, and management of anxiety.   Relaxation: - Provides group, verbal and written instruction about the benefits  of relaxation for patients with heart/lung  disease. Also provides patients with examples of relaxation techniques.   Cardiac Medications: - Group verbal and written instruction to review commonly prescribed medications for heart disease. Reviews the medication, class of the drug, and side effects.   Know Your Numbers: -Group verbal and written instruction about important numbers in your health.  Review of Cholesterol, Blood Pressure, Diabetes, and BMI and the role they play in your overall health.   Pulmonary Rehab from 10/26/2017 in [facility] Cardiac and Pulmonary Rehab  Date  10/26/17  Educator  [facility]  Instruction Review Code  1- Verbalizes Understanding      Other: -Provides group and verbal instruction on various topics (see comments)    Knowledge Questionnaire Score: Knowledge Questionnaire Score - 10/03/17 1435      Knowledge Questionnaire Score   Pre Score  9/18        Core Components/Risk Factors/Patient Goals at Admission: Personal Goals and Risk Factors at Admission - 10/03/17 1452      Core Components/Risk Factors/Patient Goals on Admission    Weight Management  Weight Loss;Obesity;Yes    Intervention  Obesity: Provide education and appropriate resources to help participant work on and attain dietary goals.;Weight Management/Obesity: Establish reasonable short term and long term weight goals.;Weight Management: Provide education and appropriate resources to help participant work on and attain dietary goals.;Weight Management: Develop a combined nutrition and exercise program designed to reach desired caloric intake, while maintaining appropriate intake of nutrient and fiber, sodium and fats, and appropriate energy expenditure required for the weight goal.    Admit Weight  258 lb 4.8 oz (117.2 kg)    Goal Weight: Short Term  253 lb (114.8 kg)    Goal Weight: Long Term  200 lb (90.7 kg)    Expected Outcomes  Short Term: Continue to assess and modify interventions until short term weight is achieved;Long Term: Adherence to  nutrition and physical activity/exercise program aimed toward attainment of established weight goal;Weight Loss: Understanding of general recommendations for a balanced deficit meal plan, which promotes 1-2 lb weight loss per week and includes a negative energy balance of [phone] kcal/d;Understanding of distribution of calorie intake throughout the day with the consumption of 4-5 meals/snacks;Understanding recommendations for meals to include 15-35% energy as protein, 25-35% energy from fat, 35-60% energy from carbohydrates, less than 232m of dietary cholesterol, 20-35 gm of total fiber daily    Tobacco Cessation  Yes    Intervention  Assist the participant in steps to quit. Provide individualized education and counseling about committing to Tobacco Cessation, relapse prevention, and pharmacological support that can be provided by physician.;OAdvice worker assist with locating and accessing local/national Quit Smoking programs, and support quit date choice.    Expected Outcomes  Short Term: Will demonstrate readiness to quit, by selecting a quit date.;Long Term: Complete abstinence from all tobacco products for at least 12 months from quit date.;Short Term: Will quit all tobacco product use, adhering to prevention of relapse plan.    Improve shortness of breath with ADL's  Yes    Intervention  Provide education, individualized exercise plan and daily activity instruction to help decrease symptoms of SOB with activities of daily living.    Expected Outcomes  Short Term: Achieves a reduction of symptoms when performing activities of daily living.    Hypertension  Yes    Intervention  Provide education on lifestyle modifcations including regular physical activity/exercise, weight management, moderate sodium restriction and increased consumption of fresh fruit,  vegetables, and low fat dairy, alcohol moderation, and smoking cessation.;Monitor prescription use compliance.    Expected Outcomes   Short Term: Continued assessment and intervention until BP is < 140/25m HG in hypertensive participants. < 130/869mHG in hypertensive participants with diabetes, heart failure or chronic kidney disease.;Long Term: Maintenance of blood pressure at goal levels.       Core Components/Risk Factors/Patient Goals Review:    Core Components/Risk Factors/Patient Goals at Discharge (Final Review):    ITP Comments: ITP Comments    Row Name 10/03/17 1358 10/03/17 1507 10/14/17 0944 10/31/17 0828     ITP Comments  Medical Evaluation completed. Chart sent for review and changes to Dr. MaEmily Filbertirector of [place] [facility] [place]  Unable to obtain all of patients goals due to her having to leave for another appointment. Will finish obtaining goals when patient returns.  TiKaytlintates she is not feeling well today and wants to meet with her doctor. She states she will try to start the first of the year.  30 day review completed. ITP sent to Dr. MaEmily Filbertirector of [place]Continue with ITP unless changes are made by physician.       Comments: 30 day review

## 2017-11-07 ENCOUNTER — Telehealth: Payer: Self-pay

## 2017-11-07 DIAGNOSIS — J4521 Mild intermittent asthma with (acute) exacerbation: Secondary | ICD-10-CM

## 2017-11-07 NOTE — Telephone Encounter (Signed)
Called Breckin to see if she was going to return to [place]. She states she needs to work so she does not get evicted and wont be returning to [employer]. Informed patient that she can return to the program with a new referral. Patient verbalizes understanding.

## 2017-11-07 NOTE — Progress Notes (Signed)
Pulmonary Individual Treatment Plan  Patient Details  Name: Wendy Ramos MRN: [id number] Date of Birth: 07/30/1966 Referring Provider:     Pulmonary Rehab from 10/03/2017 in [facility] Cardiac and Pulmonary Rehab  Referring Provider  Lynder Parents MD      Initial Encounter Date:    Pulmonary Rehab from 10/03/2017 in [facility] Cardiac and Pulmonary Rehab  Date  10/03/17  Referring Provider  Lynder Parents MD      Visit Diagnosis: Mild intermittent asthma with (acute) exacerbation  Patient's Home Medications on Admission:  Current Outpatient Medications:  .  amLODipine (NORVASC) 10 MG tablet, Take 10 mg by mouth daily., Disp: , Rfl: 3 .  benzonatate (TESSALON) 200 MG capsule, Take 1 capsule (200 mg total) by mouth 3 (three) times daily., Disp: 20 capsule, Rfl: 0 .  buPROPion (WELLBUTRIN SR) 100 MG 12 hr tablet, Take 100 mg by mouth 2 (two) times daily., Disp: , Rfl: 3 .  cetirizine (ZYRTEC) 5 MG tablet, Take 5 mg by mouth daily., Disp: , Rfl: 3 .  famotidine (PEPCID) 20 MG tablet, Take 20 mg by mouth 2 (two) times daily. , Disp: , Rfl:  .  hydrochlorothiazide (HYDRODIURIL) 25 MG tablet, Take 25 mg by mouth daily., Disp: , Rfl: 11 .  ipratropium-albuterol (DUONEB) 0.5-2.5 (3) MG/3ML SOLN, Take 3 mLs by nebulization every 6 (six) hours as needed (sob wheezing)., Disp: 360 mL, Rfl: 0 .  meloxicam (MOBIC) 15 MG tablet, Take 15 mg by mouth daily., Disp: , Rfl: 3 .  methocarbamol (ROBAXIN) 500 MG tablet, Take 2 tablets (1,000 mg total) by mouth 4 (four) times daily as needed for muscle spasms (muscle spasm/pain). (Patient not taking: Reported on 09/10/2017), Disp: 25 tablet, Rfl: 0 .  mometasone-formoterol (DULERA) 200-5 MCG/ACT AERO, Inhale 2 puffs into the lungs 2 (two) times daily., Disp: 1 Inhaler, Rfl: 0 .  nicotine polacrilex (NICORETTE) 2 MG gum, Take 2 mg by mouth as needed for smoking cessation., Disp: , Rfl:  .  omeprazole (PRILOSEC) 40 MG capsule, Take 40 mg by mouth 2 (two) times daily., Disp:  , Rfl:  .  potassium chloride (K-DUR) 10 MEQ tablet, 10 mEq 2 (two) times daily., Disp: , Rfl: 11 .  PROAIR HFA 108 (90 Base) MCG/ACT inhaler, Inhale 2 puffs into the lungs every 6 (six) hours as needed for wheezing., Disp: , Rfl: 1  Past Medical History: Past Medical History:  Diagnosis Date  . Asthma   . Cancer [facility])    kidney cancer- 2014;   . COPD (chronic obstructive pulmonary disease) (HCC)     Tobacco Use: Social History   Tobacco Use  Smoking Status Current Every Day Smoker  . Packs/day: 0.50  . Types: Cigarettes  Smokeless Tobacco Never Used  Tobacco Comment   states she smokes marijuana, Will give cessation materials upon return    Labs: Recent Review Flowsheet Data    There is no flowsheet data to display.       Pulmonary Assessment Scores: Pulmonary Assessment Scores    Row Name 10/03/17 1432         ADL UCSD   ADL Phase  Entry     SOB Score total  55     Rest  3     Walk  4     Stairs  4     Bath  2     Dress  2     Shop  1       CAT Score   CAT  Score  24       mMRC Score   mMRC Score  1        Pulmonary Function Assessment: Pulmonary Function Assessment - 10/03/17 1456      Breath   Shortness of Breath  Limiting activity;Yes       Exercise Target Goals:    Exercise Program Goal: Individual exercise prescription set using results from initial 6 min walk test and THRR while considering  patient's activity barriers and safety.    Exercise Prescription Goal: Initial exercise prescription builds to 30-45 minutes a day of aerobic activity, 2-3 days per week.  Home exercise guidelines will be given to patient during program as part of exercise prescription that the participant will acknowledge.  Activity Barriers & Risk Stratification: Activity Barriers & Cardiac Risk Stratification - 10/03/17 1538      Activity Barriers & Cardiac Risk Stratification   Activity Barriers  Back Problems;Neck/Spine Problems;Joint  Problems;Arthritis;Deconditioning;Muscular Weakness;Shortness of Breath DJD, knees give out on occasion       6 Minute Walk: 6 Minute Walk    Row Name 10/03/17 1536         6 Minute Walk   Phase  Initial     Distance  1235 feet     Walk Time  6 minutes     # of Rest Breaks  0     MPH  2.34     METS  2.53     RPE  11     Perceived Dyspnea   1     VO2 Peak  8.86     Symptoms  Yes (comment)     Comments  knee pain 4/10 (chronic)     Resting HR  68 bpm     Resting BP  124/70     Resting Oxygen Saturation   100 %     Exercise Oxygen Saturation  during 6 min walk  88 %     Max Ex. HR  79 bpm     Max Ex. BP  126/70     2 Minute Post BP  128/70       Interval HR   1 Minute HR  78     2 Minute HR  78     3 Minute HR  77     6 Minute HR  79     2 Minute Post HR  70     Interval Heart Rate?  Yes       Interval Oxygen   Interval Oxygen?  Yes     Baseline Oxygen Saturation %  100 %     1 Minute Oxygen Saturation %  94 %     1 Minute Liters of Oxygen  0 L Room Air     2 Minute Oxygen Saturation %  88 %     2 Minute Liters of Oxygen  0 L     3 Minute Oxygen Saturation %  88 %     3 Minute Liters of Oxygen  0 L     4 Minute Oxygen Saturation %  93 %     4 Minute Liters of Oxygen  0 L     5 Minute Oxygen Saturation %  92 %     5 Minute Liters of Oxygen  0 L     6 Minute Oxygen Saturation %  95 %     6 Minute Liters of Oxygen  0 L     2 Minute  Post Oxygen Saturation %  100 %     2 Minute Post Liters of Oxygen  0 L       Oxygen Initial Assessment: Oxygen Initial Assessment - 10/03/17 1454      Home Oxygen   Home Oxygen Device  None    Sleep Oxygen Prescription  None    Home Exercise Oxygen Prescription  None    Home at Rest Exercise Oxygen Prescription  None      Initial 6 min Walk   Oxygen Used  None      Program Oxygen Prescription   Program Oxygen Prescription  None      Intervention   Short Term Goals  To learn and demonstrate proper use of respiratory  medications;To learn and understand importance of maintaining oxygen saturations>88%;To learn and demonstrate proper pursed lip breathing techniques or other breathing techniques.;To learn and understand importance of monitoring SPO2 with pulse oximeter and demonstrate accurate use of the pulse oximeter.    Long  Term Goals  Verbalizes importance of monitoring SPO2 with pulse oximeter and return demonstration;Maintenance of O2 saturations>88%;Compliance with respiratory medication;Demonstrates proper use of MDI's;Exhibits proper breathing techniques, such as pursed lip breathing or other method taught during program session       Oxygen Re-Evaluation: Oxygen Re-Evaluation    Row Name 10/21/17 1214             Program Oxygen Prescription   Program Oxygen Prescription  None         Home Oxygen   Home Oxygen Device  None       Sleep Oxygen Prescription  None       Home Exercise Oxygen Prescription  None       Home at Rest Exercise Oxygen Prescription  None         Goals/Expected Outcomes   Short Term Goals  To learn and demonstrate proper use of respiratory medications;To learn and understand importance of maintaining oxygen saturations>88%;To learn and demonstrate proper pursed lip breathing techniques or other breathing techniques.;To learn and understand importance of monitoring SPO2 with pulse oximeter and demonstrate accurate use of the pulse oximeter.       Long  Term Goals  Verbalizes importance of monitoring SPO2 with pulse oximeter and return demonstration;Maintenance of O2 saturations>88%;Compliance with respiratory medication;Demonstrates proper use of MDI's;Exhibits proper breathing techniques, such as pursed lip breathing or other method taught during program session       Comments  Reviewed PLB technique with pt.  Talked about how it work and it's important to maintaining his exercise saturations.         Goals/Expected Outcomes  Short: Become more profiecient at using PLB.    Long: Become independent at using PLB.          Oxygen Discharge (Final Oxygen Re-Evaluation): Oxygen Re-Evaluation - 10/21/17 1214      Program Oxygen Prescription   Program Oxygen Prescription  None      Home Oxygen   Home Oxygen Device  None    Sleep Oxygen Prescription  None    Home Exercise Oxygen Prescription  None    Home at Rest Exercise Oxygen Prescription  None      Goals/Expected Outcomes   Short Term Goals  To learn and demonstrate proper use of respiratory medications;To learn and understand importance of maintaining oxygen saturations>88%;To learn and demonstrate proper pursed lip breathing techniques or other breathing techniques.;To learn and understand importance of monitoring SPO2 with pulse oximeter and demonstrate accurate use  of the pulse oximeter.    Long  Term Goals  Verbalizes importance of monitoring SPO2 with pulse oximeter and return demonstration;Maintenance of O2 saturations>88%;Compliance with respiratory medication;Demonstrates proper use of MDI's;Exhibits proper breathing techniques, such as pursed lip breathing or other method taught during program session    Comments  Reviewed PLB technique with pt.  Talked about how it work and it's important to maintaining his exercise saturations.      Goals/Expected Outcomes  Short: Become more profiecient at using PLB.   Long: Become independent at using PLB.       Initial Exercise Prescription: Initial Exercise Prescription - 10/03/17 1500      Date of Initial Exercise RX and Referring Provider   Date  10/03/17    Referring Provider  Lynder Parents MD      Treadmill   MPH  2    Grade  0.5    Minutes  15    METs  2.67      NuStep   Level  2    SPM  80    Minutes  15    METs  2.5      REL-XR   Level  1    Speed  50    Minutes  15    METs  2.5      Prescription Details   Frequency (times per week)  2 can only attend on Mon and Friday    Duration  Progress to 45 minutes of aerobic exercise without  signs/symptoms of physical distress      Intensity   THRR 40-80% of Max Heartrate  108-148    Ratings of Perceived Exertion  11-15    Perceived Dyspnea  0-4      Progression   Progression  Continue to progress workloads to maintain intensity without signs/symptoms of physical distress.      Resistance Training   Training Prescription  Yes    Weight  3 lbs    Reps  10-15       Perform Capillary Blood Glucose checks as needed.  Exercise Prescription Changes: Exercise Prescription Changes    Row Name 10/03/17 1500 11/02/17 1200           Response to Exercise   Blood Pressure (Admit)  124/70  126/70      Blood Pressure (Exercise)  126/70  -      Blood Pressure (Exit)  128/70  122/64      Heart Rate (Admit)  68 bpm  73 bpm      Heart Rate (Exercise)  79 bpm  93 bpm      Heart Rate (Exit)  70 bpm  64 bpm      Oxygen Saturation (Admit)  100 %  100 %      Oxygen Saturation (Exercise)  88 %  98 %      Oxygen Saturation (Exit)  100 %  98 %      Rating of Perceived Exertion (Exercise)  11  11      Perceived Dyspnea (Exercise)  1  1      Symptoms  knee pain 4/10  none      Comments  walk test results  -      Duration  -  Progress to 45 minutes of aerobic exercise without signs/symptoms of physical distress      Intensity  -  THRR unchanged        Progression   Progression  -  Continue to  progress workloads to maintain intensity without signs/symptoms of physical distress.      Average METs  -  1.9        Resistance Training   Training Prescription  -  Yes      Weight  -  3 lbs      Reps  -  10-15        Treadmill   MPH  -  2      Grade  -  0.5      Minutes  -  15      METs  -  2.67        REL-XR   Level  -  1      Speed  -  50      Minutes  -  15      METs  -  1.2         Exercise Comments: Exercise Comments    Row Name 10/21/17 1020           Exercise Comments  First full day of exercise!  Patient was oriented to gym and equipment including functions,  settings, policies, and procedures.  Patient's individual exercise prescription and treatment plan were reviewed.  All starting workloads were established based on the results of the 6 minute walk test done at initial orientation visit.  The plan for exercise progression was also introduced and progression will be customized based on patient's performance and goals.          Exercise Goals and Review: Exercise Goals    Row Name 10/03/17 1540             Exercise Goals   Increase Physical Activity  Yes       Intervention  Provide advice, education, support and counseling about physical activity/exercise needs.;Develop an individualized exercise prescription for aerobic and resistive training based on initial evaluation findings, risk stratification, comorbidities and participant's personal goals.       Expected Outcomes  Achievement of increased cardiorespiratory fitness and enhanced flexibility, muscular endurance and strength shown through measurements of functional capacity and personal statement of participant.       Increase Strength and Stamina  Yes       Intervention  Provide advice, education, support and counseling about physical activity/exercise needs.;Develop an individualized exercise prescription for aerobic and resistive training based on initial evaluation findings, risk stratification, comorbidities and participant's personal goals.       Expected Outcomes  Achievement of increased cardiorespiratory fitness and enhanced flexibility, muscular endurance and strength shown through measurements of functional capacity and personal statement of participant.       Able to understand and use rate of perceived exertion (RPE) scale  Yes       Intervention  Provide education and explanation on how to use RPE scale       Expected Outcomes  Short Term: Able to use RPE daily in rehab to express subjective intensity level;Long Term:  Able to use RPE to guide intensity level when exercising  independently       Able to understand and use Dyspnea scale  Yes       Intervention  Provide education and explanation on how to use Dyspnea scale       Expected Outcomes  Short Term: Able to use Dyspnea scale daily in rehab to express subjective sense of shortness of breath during exertion;Long Term: Able to use Dyspnea scale to guide intensity level when exercising independently  Knowledge and understanding of Target Heart Rate Range (THRR)  Yes       Intervention  Provide education and explanation of THRR including how the numbers were predicted and where they are located for reference       Expected Outcomes  Short Term: Able to state/look up THRR;Long Term: Able to use THRR to govern intensity when exercising independently;Short Term: Able to use daily as guideline for intensity in rehab       Able to check pulse independently  Yes       Intervention  Provide education and demonstration on how to check pulse in carotid and radial arteries.;Review the importance of being able to check your own pulse for safety during independent exercise       Expected Outcomes  Short Term: Able to explain why pulse checking is important during independent exercise;Long Term: Able to check pulse independently and accurately       Understanding of Exercise Prescription  Yes       Intervention  Provide education, explanation, and written materials on patient's individual exercise prescription       Expected Outcomes  Long Term: Able to explain home exercise prescription to exercise independently;Short Term: Able to explain program exercise prescription          Exercise Goals Re-Evaluation : Exercise Goals Re-Evaluation    [place] Name 10/21/17 1020             Exercise Goal Re-Evaluation   Exercise Goals Review  Understanding of Exercise Prescription;Knowledge and understanding of Target Heart Rate Range (THRR);Able to understand and use rate of perceived exertion (RPE) scale;Able to understand and use  Dyspnea scale       Comments  Reviewed RPE scale, THR and program prescription with pt today.  Pt voiced understanding and was given a copy of goals to take home.        Expected Outcomes  Short: Use RPE daily to regulate intensity.  Long: Follow program prescription in THR.          Discharge Exercise Prescription (Final Exercise Prescription Changes): Exercise Prescription Changes - 11/02/17 1200      Response to Exercise   Blood Pressure (Admit)  126/70    Blood Pressure (Exit)  122/64    Heart Rate (Admit)  73 bpm    Heart Rate (Exercise)  93 bpm    Heart Rate (Exit)  64 bpm    Oxygen Saturation (Admit)  100 %    Oxygen Saturation (Exercise)  98 %    Oxygen Saturation (Exit)  98 %    Rating of Perceived Exertion (Exercise)  11    Perceived Dyspnea (Exercise)  1    Symptoms  none    Duration  Progress to 45 minutes of aerobic exercise without signs/symptoms of physical distress    Intensity  THRR unchanged      Progression   Progression  Continue to progress workloads to maintain intensity without signs/symptoms of physical distress.    Average METs  1.9      Resistance Training   Training Prescription  Yes    Weight  3 lbs    Reps  10-15      Treadmill   MPH  2    Grade  0.5    Minutes  15    METs  2.67      REL-XR   Level  1    Speed  50    Minutes  15  METs  1.2       Nutrition:  Target Goals: Understanding of nutrition guidelines, daily intake of sodium <1538m, cholesterol <2041m calories 30% from fat and 7% or less from saturated fats, daily to have 5 or more servings of fruits and vegetables.  Biometrics: Pre Biometrics - 10/03/17 1541      Pre Biometrics   Height  5' 4.8" (1.646 m)    Weight  258 lb 4.8 oz (117.2 kg)    Waist Circumference  43 inches    Hip Circumference  55 inches    Waist to Hip Ratio  0.78 %    BMI (Calculated)  43.24        Nutrition Therapy Plan and Nutrition Goals: Nutrition Therapy & Goals - 10/03/17 1443       Intervention Plan   Intervention  Prescribe, educate and counsel regarding individualized specific dietary modifications aiming towards targeted core components such as weight, hypertension, lipid management, diabetes, heart failure and other comorbidities.;Nutrition handout(s) given to patient.    Expected Outcomes  Short Term Goal: Understand basic principles of dietary content, such as calories, fat, sodium, cholesterol and nutrients.;Long Term Goal: Adherence to prescribed nutrition plan.       Nutrition Assessments:   Nutrition Goals Re-Evaluation:   Nutrition Goals Discharge (Final Nutrition Goals Re-Evaluation):   Psychosocial: Target Goals: Acknowledge presence or absence of significant depression and/or stress, maximize coping skills, provide positive support system. Participant is able to verbalize types and ability to use techniques and skills needed for reducing stress and depression.   Initial Review & Psychosocial Screening: Initial Psych Review & Screening - 10/03/17 1456      Initial Review   Current issues with  Current Depression;Current Stress Concerns    Source of Stress Concerns  Chronic Illness;Retirement/disability;Unable to perform yard/household activities;Unable to participate in former interests or hobbies    Comments  Based off PHQ9 scores      Barriers   Psychosocial barriers to participate in program  The patient should benefit from training in stress management and relaxation.      Screening Interventions   Interventions  Yes;Encouraged to exercise;Program counselor consult;Provide feedback about the scores to participant;To provide support and resources with identified psychosocial needs;Other (comment)    Comments  patient would like to have treatment    Expected Outcomes  Short Term goal: Utilizing psychosocial counselor, staff and physician to assist with identification of specific Stressors or current issues interfering with healing process. Setting  desired goal for each stressor or current issue identified.;Long Term Goal: Stressors or current issues are controlled or eliminated.;Short Term goal: Identification and review with participant of any Quality of Life or Depression concerns found by scoring the questionnaire.;Long Term goal: The participant improves quality of Life and PHQ9 Scores as seen by post scores and/or verbalization of changes       Quality of Life Scores:  Scores of 19 and below usually indicate a poorer quality of life in these areas.  A difference of  2-3 points is a clinically meaningful difference.  A difference of 2-3 points in the total score of the Quality of Life Index has been associated with significant improvement in overall quality of life, self-image, physical symptoms, and general health in studies assessing change in quality of life.  PHQ-9: Recent Review Flowsheet Data    Dep[facility]/9 10/03/2017   Decreased Interest 3   Down, Depressed, Hopeless 3   PHQ - 2 Score 6   Altered  sleeping 3   Tired, decreased energy 3   Change in appetite 3   Feeling bad or failure about yourself  0   Trouble concentrating 2   Moving slowly or fidgety/restless 3   Suicidal thoughts 1    PHQ-9 Score 21   Difficult doing work/chores Extremely dIfficult     Interpretation of Total Score  Total Score Depression Severity:  1-4 = Minimal depression, 5-9 = Mild depression, 10-14 = Moderate depression, 15-19 = Moderately severe depression, 20-27 = Severe depression   Psychosocial Evaluation and Intervention: Psychosocial Evaluation - 10/26/17 1145      Psychosocial Evaluation & Interventions   Interventions  Therapist referral;Stress management education;Relaxation education;Encouraged to exercise with the program and follow exercise prescription    Comments  Counselor met with Wendy Ramos) for initial psychosocial evaluation.  She will be 52 years old in a few weeks and was diagnosed this past year with  COPD. Wendy Ramos lives with her grandson and has a close friend who is very supportive of her.  She has multiple health issues being a kidney cancer survivor. of 3 years.  She has GERD; dermatological concerns currently and has sleep apnea that has returned subsequent to her weight gain over the past year.  Wendy Ramos states she only sleeps maybe 3-4 hours/night.  Counselor encouraged her to call her Dr. about this.  She reports a history of depression and anxiety and was prescribed medications for this yesterday to begin soon.  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  Wendy Ramos has multiple stressors in her life currently and over the past year with a move to this community this past fall; a failed business venture resulting financial issues; loss of disability income as a result of business venture and conflict in her family system with her children and a sister.  Counselor provided Wendy Ramos with mental health resources for a counselor/group to provide supportive services to her during this time.  She has goals to breathe better; lose some weight and be able to walk outside again in the near future.  Counselor and staff will follow with Wendy Ramos throughout the course of this program.      Expected Outcomes  Allure will benefit from consistent exercise to achieve her stated goals.  She will contact her Dr. about her returned Sleep Apnea and will contact a mental health provider for supportive services.  Cire will also benefit from the educational and psychoeducational components of this program to learn more about her condition and more positive coping strategies.  Counselor will follow with her.     Continue Psychosocial Services   Follow up required by counselor       Psychosocial Re-Evaluation:   Psychosocial Discharge (Final Psychosocial Re-Evaluation):   Education: Education Goals: Education classes will be provided on a weekly basis, covering required topics. Participant will state understanding/return  demonstration of topics presented.  Learning Barriers/Preferences: Learning Barriers/Preferences - 10/03/17 1455      Learning Barriers/Preferences   Learning Barriers  Sight wears glasses    Learning Preferences  None       Education Topics: Initial Evaluation Education: - Verbal, written and demonstration of respiratory meds, RPE/PD scales, oximetry and breathing techniques. Instruction on use of nebulizers and MDIs: cleaning and proper use, rinsing mouth with steroid doses and importance of monitoring MDI activations.   Pulmonary Rehab from 10/26/2017 in [facility] Card[facility] Pulmonary Rehab  Date  10/03/17  Educator  [facility]  Instruction Review Code  1- Verbalizes Understanding  General Nutrition Guidelines/Fats and Fiber: -Group instruction provided by verbal, written material, models and posters to present the general guidelines for heart healthy nutrition. Gives an explanation and review of dietary fats and fiber.   Controlling Sodium/Reading Food Labels: -Group verbal and written material supporting the discussion of sodium use in heart healthy nutrition. Review and explanation with models, verbal and written materials for utilization of the food label.   Exercise Physiology & Risk Factors: - Group verbal and written instruction with models to review the exercise physiology of the cardiovascular system and associated critical values. Details cardiovascular disease risk factors and the goals associated with each risk factor.   Pulmonary Rehab from 10/26/2017 in [facility] Cardiac and Pulmonary Rehab  Date  10/21/17  Educator  [facility]  Instruction Review Code  1- Verbalizes Understanding      Aerobic Exercise & Resistance Training: - Gives group verbal and written discussion on the health impact of inactivity. On the components of aerobic and resistive training programs and the benefits of this training and how to safely progress through these programs.   Flexibility, Balance, General  Exercise Guidelines: - Provides group verbal and written instruction on the benefits of flexibility and balance training programs. Provides general exercise guidelines with specific guidelines to those with heart or lung disease. Demonstration and skill practice provided.   Stress Management: - Provides group verbal and written instruction about the health risks of elevated stress, cause of high stress, and healthy ways to reduce stress.   Depression: - Provides group verbal and written instruction on the correlation between heart/lung disease and depressed mood, treatment options, and the stigmas associated with seeking treatment.   Exercise & Equipment Safety: - Individual verbal instruction and demonstration of equipment use and safety with use of the equipment.   Pulmonary Rehab from 10/26/2017 in [facility] Cardiac and Pulmonary Rehab  Date  10/03/17  Educator  [facility]  Instruction Review Code  1- Verbalizes Understanding      Infection Prevention: - Provides verbal and written material to individual with discussion of infection control including proper hand washing and proper equipment cleaning during exercise session.   Pulmonary Rehab from 10/26/2017 in [facility] Cardiac and Pulmonary Rehab  Date  10/03/17  Educator  [facility]  Instruction Review Code  1- Verbalizes Understanding      Falls Prevention: - Provides verbal and written material to individual with discussion of falls prevention and safety.   Pulmonary Rehab from 10/26/2017 in [facility] Cardiac and Pulmonary Rehab  Date  10/03/17  Educator  [facility]  Instruction Review Code  1- Verbalizes Understanding      Diabetes: - Individual verbal and written instruction to review signs/symptoms of diabetes, desired ranges of glucose level fasting, after meals and with exercise. Advice that pre and post exercise glucose checks will be done for 3 sessions at entry of program.   Chronic Lung Diseases: - Group verbal and written instruction to review new  updates, new respiratory medications, new advancements in procedures and treatments. Provide informative websites and "800" numbers of self-education.   Lung Procedures: - Group verbal and written instruction to describe testing methods done to diagnose lung disease. Review the outcome of test results. Describe the treatment choices: Pulmonary Function Tests, ABGs and oximetry.   Energy Conservation: - Provide group verbal and written instruction for methods to conserve energy, plan and organize activities. Instruct on pacing techniques, use of adaptive equipment and posture/positioning to relieve shortness of breath.   Triggers: - Group verbal and written  instruction to review types of environmental controls: home humidity, furnaces, filters, dust mite/pet prevention, HEPA vacuums. To discuss weather changes, air quality and the benefits of nasal washing.   Exacerbations: - Group verbal and written instruction to provide: warning signs, infection symptoms, calling MD promptly, preventive modes, and value of vaccinations. Review: effective airway clearance, coughing and/or vibration techniques. Create an Sports administrator.   Oxygen: - Individual and group verbal and written instruction on oxygen therapy. Includes supplement oxygen, available portable oxygen systems, continuous and intermittent flow rates, oxygen safety, concentrators, and Medicare reimbursement for oxygen.   Respiratory Medications: - Group verbal and written instruction to review medications for lung disease. Drug class, frequency, complications, importance of spacers, rinsing mouth after steroid MDI's, and proper cleaning methods for nebulizers.   AED/CPR: - Group verbal and written instruction with the use of models to demonstrate the basic use of the AED with the basic ABC's of resuscitation.   Breathing Retraining: - Provides individuals verbal and written instruction on purpose, frequency, and proper technique of  diaphragmatic breathing and pursed-lipped breathing. Applies individual practice skills.   Pulmonary Rehab from 10/26/2017 in [facility] Cardiac and Pulmonary Rehab  Date  10/03/17  Educator  [facility]  Instruction Review Code  1- Verbalizes Understanding      Anatomy and Physiology of the Lungs: - Group verbal and written instruction with the use of models to provide basic lung anatomy and physiology related to function, structure and complications of lung disease.   Anatomy & Physiology of the Heart: - Group verbal and written instruction and models provide basic cardiac anatomy and physiology, with the coronary electrical and arterial systems. Review of: AMI, Angina, Valve disease, Heart Failure, Cardiac Arrhythmia, Pacemakers, and the ICD.   Heart Failure: - Group verbal and written instruction on the basics of heart failure: signs/symptoms, treatments, explanation of ejection fraction, enlarged heart and cardiomyopathy.   Sleep Apnea: - Individual verbal and written instruction to review Obstructive Sleep Apnea. Review of risk factors, methods for diagnosing and types of masks and machines for OSA.   Anxiety: - Provides group, verbal and written instruction on the correlation between heart/lung disease and anxiety, treatment options, and management of anxiety.   Relaxation: - Provides group, verbal and written instruction about the benefits of relaxation for patients with heart/lung disease. Also provides patients with examples of relaxation techniques.   Cardiac Medications: - Group verbal and written instruction to review commonly prescribed medications for heart disease. Reviews the medication, class of the drug, and side effects.   Know Your Numbers: -Group verbal and written instruction about important numbers in your health.  Review of Cholesterol, Blood Pressure, Diabetes, and BMI and the role they play in your overall health.   Pulmonary Rehab from 10/26/2017 in [facility] Cardiac and  Pulmonary Rehab  Date  10/26/17  Educator  [facility]  Instruction Review Code  1- Verbalizes Understanding      Other: -Provides group and verbal instruction on various topics (see comments)    Knowledge Questionnaire Score: Knowledge Questionnaire Score - 10/03/17 1435      Knowledge Questionnaire Score   Pre Score  9/18        Core Components/Risk Factors/Patient Goals at Admission: Personal Goals and Risk Factors at Admission - 10/03/17 1452      Core Components/Risk Factors/Patient Goals on Admission    Weight Management  Weight Loss;Obesity;Yes    Intervention  Obesity: Provide education and appropriate resources to help participant work on and attain dietary goals.;Weight  Management/Obesity: Establish reasonable short term and long term weight goals.;Weight Management: Provide education and appropriate resources to help participant work on and attain dietary goals.;Weight Management: Develop a combined nutrition and exercise program designed to reach desired caloric intake, while maintaining appropriate intake of nutrient and fiber, sodium and fats, and appropriate energy expenditure required for the weight goal.    Admit Weight  258 lb 4.8 oz (117.2 kg)    Goal Weight: Short Term  253 lb (114.8 kg)    Goal Weight: Long Term  200 lb (90.7 kg)    Expected Outcomes  Short Term: Continue to assess and modify interventions until short term weight is achieved;Long Term: Adherence to nutrition and physical activity/exercise program aimed toward attainment of established weight goal;Weight Loss: Understanding of general recommendations for a balanced deficit meal plan, which promotes 1-2 lb weight loss per week and includes a negative energy balance of [phone] kcal/d;Understanding of distribution of calorie intake throughout the day with the consumption of 4-5 meals/snacks;Understanding recommendations for meals to include 15-35% energy as protein, 25-35% energy from fat, 35-60% energy from  carbohydrates, less than 244m of dietary cholesterol, 20-35 gm of total fiber daily    Tobacco Cessation  Yes    Intervention  Assist the participant in steps to quit. Provide individualized education and counseling about committing to Tobacco Cessation, relapse prevention, and pharmacological support that can be provided by physician.;OAdvice worker assist with locating and accessing local/national Quit Smoking programs, and support quit date choice.    Expected Outcomes  Short Term: Will demonstrate readiness to quit, by selecting a quit date.;Long Term: Complete abstinence from all tobacco products for at least 12 months from quit date.;Short Term: Will quit all tobacco product use, adhering to prevention of relapse plan.    Improve shortness of breath with ADL's  Yes    Intervention  Provide education, individualized exercise plan and daily activity instruction to help decrease symptoms of SOB with activities of daily living.    Expected Outcomes  Short Term: Achieves a reduction of symptoms when performing activities of daily living.    Hypertension  Yes    Intervention  Provide education on lifestyle modifcations including regular physical activity/exercise, weight management, moderate sodium restriction and increased consumption of fresh fruit, vegetables, and low fat dairy, alcohol moderation, and smoking cessation.;Monitor prescription use compliance.    Expected Outcomes  Short Term: Continued assessment and intervention until BP is < 140/967mHG in hypertensive participants. < 130/809mG in hypertensive participants with diabetes, heart failure or chronic kidney disease.;Long Term: Maintenance of blood pressure at goal levels.       Core Components/Risk Factors/Patient Goals Review:    Core Components/Risk Factors/Patient Goals at Discharge (Final Review):    ITP Comments: ITP Comments    Row Name 10/03/17 1358 10/03/17 1507 10/14/17 0944 10/31/17 0828 11/07/17 0826     ITP Comments  Medical Evaluation completed. Chart sent for review and changes to Dr. MarEmily Filbertrector of [place] [facility]  Unable to obtain all of patients goals due to her having to leave for another appointment. Will finish obtaining goals when patient returns.  Wendy Ramos she is not feeling well today and wants to meet with her doctor. She states she will try to start the first of the year.  30 day review completed. ITP sent to Dr. [place]Emily [place]ber[place]ctor [place] unless changes are made by physician.  Called Wendy Ramos  see if she was going to return to [place]. She states she needs to work so she does not get evicted and wont be returning to [employer]. Informed patient that she can return to the program with a new referral. Patient verbalizes understanding.   [place] Name 11/07/17 0839           ITP Comments  Discharge ITP sent and signed by Dr. Sabra Heck.  Discharge Summary routed to PCP and Pulmonologist.          Comments: Discharge ITP

## 2017-11-07 NOTE — Progress Notes (Addendum)
Discharge Progress Report  Patient Details  Name: Wendy Ramos MRN: [id number] Date of Birth: February 05, 1966 Referring Provider:     Pulmonary Rehab from 10/03/2017 in [facility] Pul[facility]  Referring Provider  Lynder Parents MD       Number of Visits: 3/36  Reason for Discharge:  Early Exit:  Back to work and Personal  Smoking History:  Social History   Tobacco Use  Smoking Status Current Every Day Smoker  . Packs/day: 0.50  . Types: Cigarettes  Smokeless Tobacco Never Used  Tobacco Comment   states she smokes marijuana, Will give cessation materials upon return    Diagnosis:  Mild intermittent asthma with (acute) exacerbation  ADL UCSD: Pulmonary Assessment Scores    Row Name 10/03/17 1432         ADL UCSD   ADL Phase  Entry     SOB Score total  55     Rest  3     Walk  4     Stairs  4     Bath  2     Dress  2     Shop  1       CAT Score   CAT Score  24       mMRC Score   mMRC Score  1        Initial Exercise Prescription: Initial Exercise Prescription - 10/03/17 1500      Date of Initial Exercise RX and Referring Provider   Date  10/03/17    Referring Provider  Lynder Parents MD      Treadmill   MPH  2    Grade  0.5    Minutes  15    METs  2.67      NuStep   Level  2    SPM  80    Minutes  15    METs  2.5      REL-XR   Level  1    Speed  50    Minutes  15    METs  2.5      Prescription Details   Frequency (times per week)  2 can only attend on Mon and Friday    Duration  Progress to 45 minutes of aerobic exercise without signs/symptoms of physical distress      Intensity   THRR 40-80% of Max Heartrate  108-148    Ratings of Perceived Exertion  11-15    Perceived Dyspnea  0-4      Progression   Progression  Continue to progress workloads to maintain intensity without signs/symptoms of physical distress.      Resistance Training   Training Prescription  Yes    Weight  3 lbs    Reps  10-15       Discharge Exercise  Prescription (Final Exercise Prescription Changes): Exercise Prescription Changes - 11/02/17 1200      Response to Exercise   Blood Pressure (Admit)  126/70    Blood Pressure (Exit)  122/64    Heart Rate (Admit)  73 bpm    Heart Rate (Exercise)  93 bpm    Heart Rate (Exit)  64 bpm    Oxygen Saturation (Admit)  100 %    Oxygen Saturation (Exercise)  98 %    Oxygen Saturation (Exit)  98 %    Rating of Perceived Exertion (Exercise)  11    Perceived Dyspnea (Exercise)  1    Symptoms  none    Duration  Progress to 45 minutes of aerobic exercise without signs/symptoms of physical distress    Intensity  THRR unchanged      Progression   Progression  Continue to progress workloads to maintain intensity without signs/symptoms of physical distress.    Average METs  1.9      Resistance Training   Training Prescription  Yes    Weight  3 lbs    Reps  10-15      Treadmill   MPH  2    Grade  0.5    Minutes  15    METs  2.67      REL-XR   Level  1    Speed  50    Minutes  15    METs  1.2       Functional Capacity: 6 Minute Walk    Row Name 10/03/17 1536         6 Minute Walk   Phase  Initial     Distance  1235 feet     Walk Time  6 minutes     # of Rest Breaks  0     MPH  2.34     METS  2.53     RPE  11     Perceived Dyspnea   1     VO2 Peak  8.86     Symptoms  Yes (comment)     Comments  knee pain 4/10 (chronic)     Resting HR  68 bpm     Resting BP  124/70     Resting Oxygen Saturation   100 %     Exercise Oxygen Saturation  during 6 min walk  88 %     Max Ex. HR  79 bpm     Max Ex. BP  126/70     2 Minute Post BP  128/70       Interval HR   1 Minute HR  78     2 Minute HR  78     3 Minute HR  77     6 Minute HR  79     2 Minute Post HR  70     Interval Heart Rate?  Yes       Interval Oxygen   Interval Oxygen?  Yes     Baseline Oxygen Saturation %  100 %     1 Minute Oxygen Saturation %  94 %     1 Minute Liters of Oxygen  0 L Room Air     2 Minute  Oxygen Saturation %  88 %     2 Minute Liters of Oxygen  0 L     3 Minute Oxygen Saturation %  88 %     3 Minute Liters of Oxygen  0 L     4 Minute Oxygen Saturation %  93 %     4 Minute Liters of Oxygen  0 L     5 Minute Oxygen Saturation %  92 %     5 Minute Liters of Oxygen  0 L     6 Minute Oxygen Saturation %  95 %     6 Minute Liters of Oxygen  0 L     2 Minute Post Oxygen Saturation %  100 %     2 Minute Post Liters of Oxygen  0 L        Psychological, QOL, Others - Outcomes: PHQ 2/9: Depression screen [facility] 2/9 10/03/2017  Decreased Interest 3  Down, Depressed, Hopeless 3  PHQ - 2 Score 6  Altered sleeping 3  Tired, decreased energy 3  Change in appetite 3  Feeling bad or failure about yourself  0  Trouble concentrating 2  Moving slowly or fidgety/restless 3  Suicidal thoughts 1  PHQ-9 Score 21  Difficult doing work/chores Extremely dIfficult    Quality of Life:   Personal Goals: Goals established at orientation with interventions provided to work toward goal. Personal Goals and Risk Factors at Admission - 10/03/17 1452      Core Components/Risk Factors/Patient Goals on Admission    Weight Management  Weight Loss;Obesity;Yes    Intervention  Obesity: Provide education and appropriate resources to help participant work on and attain dietary goals.;Weight Management/Obesity: Establish reasonable short term and long term weight goals.;Weight Management: Provide education and appropriate resources to help participant work on and attain dietary goals.;Weight Management: Develop a combined nutrition and exercise program designed to reach desired caloric intake, while maintaining appropriate intake of nutrient and fiber, sodium and fats, and appropriate energy expenditure required for the weight goal.    Admit Weight  258 lb 4.8 oz (117.2 kg)    Goal Weight: Short Term  253 lb (114.8 kg)    Goal Weight: Long Term  200 lb (90.7 kg)    Expected Outcomes  Short Term: Continue  to assess and modify interventions until short term weight is achieved;Long Term: Adherence to nutrition and physical activity/exercise program aimed toward attainment of established weight goal;Weight Loss: Understanding of general recommendations for a balanced deficit meal plan, which promotes 1-2 lb weight loss per week and includes a negative energy balance of [phone] kcal/d;Understanding of distribution of calorie intake throughout the day with the consumption of 4-5 meals/snacks;Understanding recommendations for meals to include 15-35% energy as protein, 25-35% energy from fat, 35-60% energy from carbohydrates, less than 200mg  of dietary cholesterol, 20-35 gm of total fiber daily    Tobacco Cessation  Yes    Intervention  Assist the participant in steps to quit. Provide individualized education and counseling about committing to Tobacco Cessation, relapse prevention, and pharmacological support that can be provided by physician.;Advice worker, assist with locating and accessing local/national Quit Smoking programs, and support quit date choice.    Expected Outcomes  Short Term: Will demonstrate readiness to quit, by selecting a quit date.;Long Term: Complete abstinence from all tobacco products for at least 12 months from quit date.;Short Term: Will quit all tobacco product use, adhering to prevention of relapse plan.    Improve shortness of breath with ADL's  Yes    Intervention  Provide education, individualized exercise plan and daily activity instruction to help decrease symptoms of SOB with activities of daily living.    Expected Outcomes  Short Term: Achieves a reduction of symptoms when performing activities of daily living.    Hypertension  Yes    Intervention  Provide education on lifestyle modifcations including regular physical activity/exercise, weight management, moderate sodium restriction and increased consumption of fresh fruit, vegetables, and low fat dairy, alcohol  moderation, and smoking cessation.;Monitor prescription use compliance.    Expected Outcomes  Short Term: Continued assessment and intervention until BP is < 140/29mm HG in hypertensive participants. < 130/47mm HG in hypertensive participants with diabetes, heart failure or chronic kidney disease.;Long Term: Maintenance of blood pressure at goal levels.        Personal Goals Discharge:   Exercise Goals and Review: Exercise Goals    Row  Name 10/03/17 1540             Exercise Goals   Increase Physical Activity  Yes       Intervention  Provide advice, education, support and counseling about physical activity/exercise needs.;Develop an individualized exercise prescription for aerobic and resistive training based on initial evaluation findings, risk stratification, comorbidities and participant's personal goals.       Expected Outcomes  Achievement of increased cardiorespiratory fitness and enhanced flexibility, muscular endurance and strength shown through measurements of functional capacity and personal statement of participant.       Increase Strength and Stamina  Yes       Intervention  Provide advice, education, support and counseling about physical activity/exercise needs.;Develop an individualized exercise prescription for aerobic and resistive training based on initial evaluation findings, risk stratification, comorbidities and participant's personal goals.       Expected Outcomes  Achievement of increased cardiorespiratory fitness and enhanced flexibility, muscular endurance and strength shown through measurements of functional capacity and personal statement of participant.       Able to understand and use rate of perceived exertion (RPE) scale  Yes       Intervention  Provide education and explanation on how to use RPE scale       Expected Outcomes  Short Term: Able to use RPE daily in rehab to express subjective intensity level;Long Term:  Able to use RPE to guide intensity level  when exercising independently       Able to understand and use Dyspnea scale  Yes       Intervention  Provide education and explanation on how to use Dyspnea scale       Expected Outcomes  Short Term: Able to use Dyspnea scale daily in rehab to express subjective sense of shortness of breath during exertion;Long Term: Able to use Dyspnea scale to guide intensity level when exercising independently       Knowledge and understanding of Target Heart Rate Range (THRR)  Yes       Intervention  Provide education and explanation of THRR including how the numbers were predicted and where they are located for reference       Expected Outcomes  Short Term: Able to state/look up THRR;Long Term: Able to use THRR to govern intensity when exercising independently;Short Term: Able to use daily as guideline for intensity in rehab       Able to check pulse independently  Yes       Intervention  Provide education and demonstration on how to check pulse in carotid and radial arteries.;Review the importance of being able to check your own pulse for safety during independent exercise       Expected Outcomes  Short Term: Able to explain why pulse checking is important during independent exercise;Long Term: Able to check pulse independently and accurately       Understanding of Exercise Prescription  Yes       Intervention  Provide education, explanation, and written materials on patient's individual exercise prescription       Expected Outcomes  Long Term: Able to explain home exercise prescription to exercise independently;Short Term: Able to explain program exercise prescription          Nutrition & Weight - Outcomes: Pre Biometrics - 10/03/17 1541      Pre Biometrics   Height  5' 4.8" (1.646 m)    Weight  258 lb 4.8 oz (117.2 kg)    Waist Circumference  43 inches  Hip Circumference  55 inches    Waist to Hip Ratio  0.78 %    BMI (Calculated)  43.24        Nutrition: Nutrition Therapy & Goals - 10/03/17  1443      Intervention Plan   Intervention  Prescribe, educate and counsel regarding individualized specific dietary modifications aiming towards targeted core components such as weight, hypertension, lipid management, diabetes, heart failure and other comorbidities.;Nutrition handout(s) given to patient.    Expected Outcomes  Short Term Goal: Understand basic principles of dietary content, such as calories, fat, sodium, cholesterol and nutrients.;Long Term Goal: Adherence to prescribed nutrition plan.       Nutrition Discharge:   Education Questionnaire Score: Knowledge Questionnaire Score - 10/03/17 1435      Knowledge Questionnaire Score   Pre Score  9/18       Goals reviewed with patient; copy given to patient.

## 2017-12-04 ENCOUNTER — Inpatient Hospital Stay
Admission: EM | Admit: 2017-12-04 | Discharge: 2017-12-06 | DRG: 193 | Disposition: A | Discharge status: Home / Self Care | Payer: Medicaid Other | Attending: Internal Medicine | Admitting: Internal Medicine

## 2017-12-04 ENCOUNTER — Encounter: Payer: Self-pay | Admitting: Emergency Medicine

## 2017-12-04 ENCOUNTER — Inpatient Hospital Stay (HOSPITAL_COMMUNITY)
Admit: 2017-12-04 | Discharge: 2017-12-04 | Disposition: A | Discharge status: Home / Self Care | Payer: Medicaid Other | Attending: Internal Medicine | Admitting: Internal Medicine

## 2017-12-04 ENCOUNTER — Emergency Department: Payer: Medicaid Other

## 2017-12-04 ENCOUNTER — Other Ambulatory Visit: Payer: Self-pay

## 2017-12-04 DIAGNOSIS — I340 Nonrheumatic mitral (valve) insufficiency: Secondary | ICD-10-CM

## 2017-12-04 DIAGNOSIS — I10 Essential (primary) hypertension: Secondary | ICD-10-CM | POA: Diagnosis present

## 2017-12-04 DIAGNOSIS — Z7951 Long term (current) use of inhaled steroids: Secondary | ICD-10-CM

## 2017-12-04 DIAGNOSIS — Z888 Allergy status to other drugs, medicaments and biological substances status: Secondary | ICD-10-CM

## 2017-12-04 DIAGNOSIS — Z791 Long term (current) use of non-steroidal anti-inflammatories (NSAID): Secondary | ICD-10-CM | POA: Diagnosis not present

## 2017-12-04 DIAGNOSIS — J101 Influenza due to other identified influenza virus with other respiratory manifestations: Principal | ICD-10-CM | POA: Diagnosis present

## 2017-12-04 DIAGNOSIS — F17210 Nicotine dependence, cigarettes, uncomplicated: Secondary | ICD-10-CM | POA: Diagnosis present

## 2017-12-04 DIAGNOSIS — Z85528 Personal history of other malignant neoplasm of kidney: Secondary | ICD-10-CM | POA: Diagnosis not present

## 2017-12-04 DIAGNOSIS — Z91018 Allergy to other foods: Secondary | ICD-10-CM

## 2017-12-04 DIAGNOSIS — Z91013 Allergy to seafood: Secondary | ICD-10-CM

## 2017-12-04 DIAGNOSIS — J9601 Acute respiratory failure with hypoxia: Secondary | ICD-10-CM | POA: Diagnosis present

## 2017-12-04 DIAGNOSIS — Z881 Allergy status to other antibiotic agents status: Secondary | ICD-10-CM

## 2017-12-04 DIAGNOSIS — J45901 Unspecified asthma with (acute) exacerbation: Secondary | ICD-10-CM | POA: Diagnosis present

## 2017-12-04 DIAGNOSIS — Z90710 Acquired absence of both cervix and uterus: Secondary | ICD-10-CM | POA: Diagnosis not present

## 2017-12-04 DIAGNOSIS — J9600 Acute respiratory failure, unspecified whether with hypoxia or hypercapnia: Secondary | ICD-10-CM

## 2017-12-04 DIAGNOSIS — Z79899 Other long term (current) drug therapy: Secondary | ICD-10-CM

## 2017-12-04 DIAGNOSIS — J449 Chronic obstructive pulmonary disease, unspecified: Secondary | ICD-10-CM | POA: Diagnosis present

## 2017-12-04 DIAGNOSIS — R079 Chest pain, unspecified: Secondary | ICD-10-CM

## 2017-12-04 DIAGNOSIS — R0602 Shortness of breath: Secondary | ICD-10-CM | POA: Diagnosis present

## 2017-12-04 DIAGNOSIS — Z8249 Family history of ischemic heart disease and other diseases of the circulatory system: Secondary | ICD-10-CM

## 2017-12-04 DIAGNOSIS — R0902 Hypoxemia: Secondary | ICD-10-CM

## 2017-12-04 LAB — INFLUENZA PANEL BY PCR (TYPE A & B)
INFLAPCR: POSITIVE — AB
Influenza B By PCR: NEGATIVE

## 2017-12-04 LAB — CBC WITH DIFFERENTIAL/PLATELET
BASOS ABS: 0 10*3/uL (ref 0–0.1)
Basophils Relative: 0 %
Eosinophils Absolute: 0 10*3/uL (ref 0–0.7)
Eosinophils Relative: 1 %
HEMATOCRIT: 36.3 % (ref 35.0–47.0)
Hemoglobin: 12.2 g/dL (ref 12.0–16.0)
LYMPHS ABS: 0.6 10*3/uL — AB (ref 1.0–3.6)
Lymphocytes Relative: 8 %
MCH: 31.2 pg (ref 26.0–34.0)
MCHC: 33.6 g/dL (ref 32.0–36.0)
MCV: 92.8 fL (ref 80.0–100.0)
MONO ABS: 0.6 10*3/uL (ref 0.2–0.9)
Monocytes Relative: 9 %
NEUTROS ABS: 5.8 10*3/uL (ref 1.4–6.5)
Neutrophils Relative %: 82 %
Platelets: 229 10*3/uL (ref 150–440)
RBC: 3.91 MIL/uL (ref 3.80–5.20)
RDW: 14.1 % (ref 11.5–14.5)
WBC: 7 10*3/uL (ref 3.6–11.0)

## 2017-12-04 LAB — COMPREHENSIVE METABOLIC PANEL
ALT: 14 U/L (ref 14–54)
AST: 24 U/L (ref 15–41)
Albumin: 4.4 g/dL (ref 3.5–5.0)
Alkaline Phosphatase: 85 U/L (ref 38–126)
Anion gap: 11 (ref 5–15)
BUN: 11 mg/dL (ref 6–20)
CHLORIDE: 106 mmol/L (ref 101–111)
CO2: 22 mmol/L (ref 22–32)
CREATININE: 1.05 mg/dL — AB (ref 0.44–1.00)
Calcium: 9.2 mg/dL (ref 8.9–10.3)
GFR calc Af Amer: >60 mL/min (ref >60)
GFR calc non Af Amer: 60 mL/min — ABNORMAL LOW (ref >60)
Glucose, Bld: 121 mg/dL — ABNORMAL HIGH (ref 65–99)
Potassium: 3.9 mmol/L (ref 3.5–5.1)
SODIUM: 139 mmol/L (ref 135–145)
Total Bilirubin: 0.7 mg/dL (ref 0.3–1.2)
Total Protein: 7.9 g/dL (ref 6.5–8.1)

## 2017-12-04 LAB — CBC
HEMATOCRIT: 34.7 % — AB (ref 35.0–47.0)
Hemoglobin: 11.5 g/dL — ABNORMAL LOW (ref 12.0–16.0)
MCH: 30.9 pg (ref 26.0–34.0)
MCHC: 33.3 g/dL (ref 32.0–36.0)
MCV: 92.9 fL (ref 80.0–100.0)
Platelets: 222 10*3/uL (ref 150–440)
RBC: 3.74 MIL/uL — AB (ref 3.80–5.20)
RDW: 14.2 % (ref 11.5–14.5)
WBC: 6.3 10*3/uL (ref 3.6–11.0)

## 2017-12-04 LAB — URINALYSIS, COMPLETE (UACMP) WITH MICROSCOPIC
BILIRUBIN URINE: NEGATIVE
Bacteria, UA: NONE SEEN
Glucose, UA: NEGATIVE mg/dL
Ketones, ur: NEGATIVE mg/dL
LEUKOCYTES UA: NEGATIVE
Nitrite: NEGATIVE
PH: 6 (ref 5.0–8.0)
Protein, ur: NEGATIVE mg/dL
SPECIFIC GRAVITY, URINE: 1.012 (ref 1.005–1.030)

## 2017-12-04 LAB — LACTIC ACID, PLASMA
Lactic Acid, Venous: 1.5 mmol/L (ref 0.5–1.9)
Lactic Acid, Venous: 0.8 mmol/L (ref 0.5–1.9)

## 2017-12-04 LAB — PROTIME-INR
INR: 1.01
Prothrombin Time: 13.2 seconds (ref 11.4–15.2)

## 2017-12-04 LAB — TROPONIN I: Troponin I: <0.03 ng/mL (ref <0.03)

## 2017-12-04 LAB — ECHOCARDIOGRAM COMPLETE
Height: 64 in
Weight: 4197.56 oz

## 2017-12-04 LAB — CREATININE, SERUM
Creatinine, Ser: 1.1 mg/dL — ABNORMAL HIGH (ref 0.44–1.00)
GFR, EST NON AFRICAN AMERICAN: 56 mL/min — AB (ref >60)

## 2017-12-04 MED ORDER — PREDNISONE 50 MG PO TABS
50.0000 mg | ORAL_TABLET | Freq: Once | ORAL | Status: AC
  Administered 2017-12-05: 50 mg via ORAL
  Filled 2017-12-04: qty 1

## 2017-12-04 MED ORDER — FAMOTIDINE 20 MG PO TABS
20.0000 mg | ORAL_TABLET | Freq: Two times a day (BID) | ORAL | Status: DC
  Administered 2017-12-04 – 2017-12-06 (×4): 20 mg via ORAL
  Filled 2017-12-04 (×4): qty 1

## 2017-12-04 MED ORDER — AMLODIPINE BESYLATE 10 MG PO TABS
10.0000 mg | ORAL_TABLET | Freq: Every day | ORAL | Status: DC
  Administered 2017-12-05 – 2017-12-06 (×2): 10 mg via ORAL
  Filled 2017-12-04 (×2): qty 1

## 2017-12-04 MED ORDER — SODIUM CHLORIDE 0.9 % IV SOLN
500.0000 mg | INTRAVENOUS | Status: DC
  Administered 2017-12-04: 500 mg via INTRAVENOUS
  Filled 2017-12-04 (×2): qty 500

## 2017-12-04 MED ORDER — PREDNISONE 50 MG PO TABS: 50.0000 mg | ORAL_TABLET | Freq: Four times a day (QID) | ORAL | Status: DC

## 2017-12-04 MED ORDER — MOMETASONE FURO-FORMOTEROL FUM 200-5 MCG/ACT IN AERO
2.0000 | INHALATION_SPRAY | Freq: Two times a day (BID) | RESPIRATORY_TRACT | Status: DC
Start: 2017-12-04 — End: 2017-12-06
  Administered 2017-12-04 – 2017-12-06 (×4): 2 via RESPIRATORY_TRACT
  Filled 2017-12-04: qty 8.8

## 2017-12-04 MED ORDER — ACETAMINOPHEN 650 MG RE SUPP: 650.0000 mg | Freq: Four times a day (QID) | RECTAL | Status: DC | PRN

## 2017-12-04 MED ORDER — SODIUM CHLORIDE 0.9 % IV BOLUS (SEPSIS)
1000.0000 mL | Freq: Once | INTRAVENOUS | Status: AC
  Administered 2017-12-04: 1000 mL via INTRAVENOUS

## 2017-12-04 MED ORDER — MELOXICAM 7.5 MG PO TABS
15.0000 mg | ORAL_TABLET | Freq: Every day | ORAL | Status: DC
  Administered 2017-12-05: 15 mg via ORAL
  Filled 2017-12-04 (×3): qty 2

## 2017-12-04 MED ORDER — METHYLPREDNISOLONE SODIUM SUCC 125 MG IJ SOLR
60.0000 mg | Freq: Four times a day (QID) | INTRAMUSCULAR | Status: DC
  Administered 2017-12-04 – 2017-12-05 (×3): 60 mg via INTRAVENOUS
  Filled 2017-12-04 (×3): qty 2

## 2017-12-04 MED ORDER — LORATADINE 10 MG PO TABS
10.0000 mg | ORAL_TABLET | Freq: Every day | ORAL | Status: DC
  Administered 2017-12-05 – 2017-12-06 (×2): 10 mg via ORAL
  Filled 2017-12-04 (×3): qty 1

## 2017-12-04 MED ORDER — IPRATROPIUM-ALBUTEROL 0.5-2.5 (3) MG/3ML IN SOLN
3.0000 mL | Freq: Four times a day (QID) | RESPIRATORY_TRACT | Status: DC
  Administered 2017-12-04 – 2017-12-05 (×3): 3 mL via RESPIRATORY_TRACT
  Filled 2017-12-04 (×3): qty 3

## 2017-12-04 MED ORDER — ONDANSETRON HCL 4 MG PO TABS: 4.0000 mg | ORAL_TABLET | Freq: Four times a day (QID) | ORAL | Status: DC | PRN

## 2017-12-04 MED ORDER — MORPHINE SULFATE (PF) 2 MG/ML IV SOLN
2.0000 mg | INTRAVENOUS | Status: DC | PRN
  Administered 2017-12-04 – 2017-12-05 (×3): 2 mg via INTRAVENOUS
  Filled 2017-12-04 (×3): qty 1

## 2017-12-04 MED ORDER — ONDANSETRON HCL 4 MG/2ML IJ SOLN: 4.0000 mg | Freq: Four times a day (QID) | INTRAMUSCULAR | Status: DC | PRN

## 2017-12-04 MED ORDER — BISACODYL 10 MG RE SUPP: 10.0000 mg | Freq: Every day | RECTAL | Status: DC | PRN

## 2017-12-04 MED ORDER — BUPROPION HCL ER (SR) 100 MG PO TB12
100.0000 mg | ORAL_TABLET | Freq: Two times a day (BID) | ORAL | Status: DC
  Administered 2017-12-04: 100 mg via ORAL
  Filled 2017-12-04 (×6): qty 1

## 2017-12-04 MED ORDER — IPRATROPIUM-ALBUTEROL 0.5-2.5 (3) MG/3ML IN SOLN: 3.0000 mL | Freq: Once | RESPIRATORY_TRACT | Status: DC

## 2017-12-04 MED ORDER — METHYLPREDNISOLONE SODIUM SUCC 125 MG IJ SOLR
125.0000 mg | Freq: Once | INTRAMUSCULAR | Status: AC
Start: 2017-12-04 — End: 2017-12-04
  Administered 2017-12-04: 125 mg via INTRAVENOUS
  Filled 2017-12-04: qty 2

## 2017-12-04 MED ORDER — ALBUTEROL SULFATE (2.5 MG/3ML) 0.083% IN NEBU
2.5000 mg | INHALATION_SOLUTION | Freq: Four times a day (QID) | RESPIRATORY_TRACT | Status: DC | PRN
  Administered 2017-12-05: 2.5 mg via RESPIRATORY_TRACT
  Filled 2017-12-04: qty 3

## 2017-12-04 MED ORDER — PANTOPRAZOLE SODIUM 40 MG PO TBEC
40.0000 mg | DELAYED_RELEASE_TABLET | Freq: Every day | ORAL | Status: DC
  Administered 2017-12-05 – 2017-12-06 (×2): 40 mg via ORAL
  Filled 2017-12-04 (×2): qty 1

## 2017-12-04 MED ORDER — DIPHENHYDRAMINE HCL 25 MG PO CAPS
50.0000 mg | ORAL_CAPSULE | Freq: Once | ORAL | Status: AC
  Administered 2017-12-05: 50 mg via ORAL
  Filled 2017-12-04: qty 2

## 2017-12-04 MED ORDER — DOCUSATE SODIUM 100 MG PO CAPS
100.0000 mg | ORAL_CAPSULE | Freq: Two times a day (BID) | ORAL | Status: DC
  Administered 2017-12-04 – 2017-12-06 (×4): 100 mg via ORAL
  Filled 2017-12-04 (×5): qty 1

## 2017-12-04 MED ORDER — SODIUM CHLORIDE 0.9 % IV SOLN
INTRAVENOUS | Status: DC
  Administered 2017-12-04 – 2017-12-05 (×2): via INTRAVENOUS

## 2017-12-04 MED ORDER — ACETAMINOPHEN 325 MG PO TABS
650.0000 mg | ORAL_TABLET | Freq: Four times a day (QID) | ORAL | Status: DC | PRN
  Administered 2017-12-04: 650 mg via ORAL
  Filled 2017-12-04: qty 2

## 2017-12-04 MED ORDER — POTASSIUM CHLORIDE CRYS ER 10 MEQ PO TBCR
10.0000 meq | EXTENDED_RELEASE_TABLET | Freq: Two times a day (BID) | ORAL | Status: DC
  Administered 2017-12-04 – 2017-12-06 (×4): 10 meq via ORAL
  Filled 2017-12-04 (×4): qty 1

## 2017-12-04 MED ORDER — HYDROCHLOROTHIAZIDE 25 MG PO TABS
25.0000 mg | ORAL_TABLET | Freq: Every day | ORAL | Status: DC
  Administered 2017-12-05: 25 mg via ORAL
  Filled 2017-12-04 (×3): qty 1

## 2017-12-04 MED ORDER — OSELTAMIVIR PHOSPHATE 75 MG PO CAPS
75.0000 mg | ORAL_CAPSULE | Freq: Two times a day (BID) | ORAL | Status: DC
  Administered 2017-12-04 – 2017-12-06 (×4): 75 mg via ORAL
  Filled 2017-12-04 (×6): qty 1

## 2017-12-04 MED ORDER — PREDNISONE 50 MG PO TABS
50.0000 mg | ORAL_TABLET | Freq: Four times a day (QID) | ORAL | Status: AC
  Administered 2017-12-04 – 2017-12-05 (×2): 50 mg via ORAL
  Filled 2017-12-04 (×2): qty 1

## 2017-12-04 MED ORDER — HYDROCOD POLST-CPM POLST ER 10-8 MG/5ML PO SUER
5.0000 mL | Freq: Two times a day (BID) | ORAL | Status: DC | PRN
  Administered 2017-12-04 – 2017-12-06 (×4): 5 mL via ORAL
  Filled 2017-12-04 (×4): qty 5

## 2017-12-04 MED ORDER — HEPARIN SODIUM (PORCINE) 5000 UNIT/ML IJ SOLN
5000.0000 [IU] | Freq: Three times a day (TID) | INTRAMUSCULAR | Status: DC
  Administered 2017-12-04 – 2017-12-06 (×5): 5000 [IU] via SUBCUTANEOUS
  Filled 2017-12-04 (×5): qty 1

## 2017-12-04 MED ORDER — OSELTAMIVIR PHOSPHATE 75 MG PO CAPS
75.0000 mg | ORAL_CAPSULE | Freq: Once | ORAL | Status: AC
  Administered 2017-12-04: 75 mg via ORAL
  Filled 2017-12-04: qty 1

## 2017-12-04 NOTE — ED Provider Notes (Signed)
[facility] Emergency Department Provider Note ____________________________________________   I have reviewed the triage vital signs and the triage nursing note.  HISTORY  Chief Complaint Shortness of Breath and Fever   Historian Patient  HPI Varie Wendy Ramos is a 52 y.o. female presents with 24 hours of increased shortness of breath and generalized body aches.  Patient took Tylenol all night long.  Still feeling bad.  Reports history of COPD and asthma and uses inhalers.  Chest pain.  No vomiting or diarrhea.  Positive for sinus and nasal congestion.  Symptoms are moderate to severe.  Things make it worse or better.   Past Medical History:  Diagnosis Date  . Asthma   . Cancer [facility])    kidney cancer- 2014;   . COPD (chronic obstructive pulmonary disease) [facility])     Patient Active Problem List   Diagnosis Date Noted  . Asthma exacerbation 09/10/2017    Past Surgical History:  Procedure Laterality Date  . ABDOMINAL HYSTERECTOMY    . CHOLECYSTECTOMY    . KIDNEY SURGERY      Prior to Admission medications   Medication Sig Start Date End Date Taking? Authorizing Provider  amLODipine (NORVASC) 10 MG tablet Take 10 mg by mouth daily. 02/24/17   [provider]  benzonatate (TESSALON) 200 MG capsule Take 1 capsule (200 mg total) by mouth 3 (three) times daily. 09/12/17   Bettey Costa, MD  buPROPion (WELLBUTRIN SR) 100 MG 12 hr tablet Take 100 mg by mouth 2 (two) times daily. 06/20/17   [provider]  cetirizine (ZYRTEC) 5 MG tablet Take 5 mg by mouth daily. 07/15/17   [provider]  famotidine (PEPCID) 20 MG tablet Take 20 mg by mouth 2 (two) times daily.     [provider]  hydrochlorothiazide (HYDRODIURIL) 25 MG tablet Take 25 mg by mouth daily. 02/24/17   [provider]  ipratropium-albuterol (DUONEB) 0.5-2.5 (3) MG/3ML SOLN Take 3 mLs by nebulization every 6 (six) hours as needed (sob wheezing). 09/12/17   Bettey Costa, MD  meloxicam (MOBIC) 15 MG tablet Take 15 mg by mouth daily. 06/20/17   [provider]  methocarbamol (ROBAXIN) 500 MG tablet Take 2 tablets (1,000 mg total) by mouth 4 (four) times daily as needed for muscle spasms (muscle spasm/pain). Patient not taking: Reported on 09/10/2017 04/29/17   Francine Graven, DO  mometasone-formoterol [facility]) 200-5 MCG/ACT AERO Inhale 2 puffs into the lungs 2 (two) times daily. 09/12/17 10/12/17  Bettey Costa, MD  nicotine polacrilex (NICORETTE) 2 MG gum Take 2 mg by mouth as needed for smoking cessation.    [provider]  omeprazole (PRILOSEC) 40 MG capsule Take 40 mg by mouth 2 (two) times daily.    [provider]  potassium chloride (K-DUR) 10 MEQ tablet 10 mEq 2 (two) times daily. 03/08/17   [provider]  PROAIR HFA 108 (90 Base) MCG/ACT inhaler Inhale 2 puffs into the lungs every 6 (six) hours as needed for wheezing. 06/19/17   [provider]    Allergies  Allergen Reactions  . Chicken Allergy Rash  . Chocolate Flavor Hives  . Fish-Derived Products Hives    Allergic to all seafood.  . Metronidazole Hives  . Cefazolin Hives    Developed local urticaria after 1.2gm ancef administration intraoperatively. Medication discontinued, no additional intervention required.  . Iopamidol Hives and Itching    Delayed reaction approx. 20 minutes post contrast.  . Beef-Derived Products Hives  . Fish Allergy  Hives  . Galactose Rash    POSSIBLE ALPHA GAL ALLERGY - rash reaction to all beef and chicken    Family History  Problem Relation Age of Onset  . Hypertension Mother   . Heart disease Mother     Social History Social History   Tobacco Use  . Smoking status: Current Every Day Smoker    Packs/day: 0.50    Types: Cigarettes  . Smokeless tobacco: Never Used  . Tobacco comment: states she smokes marijuana, Will give cessation materials upon return  Substance Use Topics  . Alcohol use: No  . Drug  use: No    Review of Systems  Constitutional: Positive for fever. Eyes: Negative for visual changes. ENT: Negative for sore throat. Cardiovascular: Negative for chest pain. Respiratory: Positive for shortness of breath. Gastrointestinal: Negative for abdominal pain, vomiting and diarrhea. Genitourinary: Negative for dysuria. Musculoskeletal: Negative for back pain. Skin: Negative for rash. Neurological: Negative for headache.  ____________________________________________   PHYSICAL EXAM:  VITAL SIGNS: ED Triage Vitals  Enc Vitals Group     BP 12/04/17 1103 (!) 144/89     Pulse Rate 12/04/17 1054 100     Resp 12/04/17 1054 (!) 22     Temp 12/04/17 1054 (!) 102.8 F (39.3 C)     Temp Source 12/04/17 1054 Oral     SpO2 12/04/17 1051 96 %     Weight 12/04/17 1055 259 lb (117.5 kg)     Height 12/04/17 1055 5\' 4"  (1.626 m)     Head Circumference --      Peak Flow --      Pain Score 12/04/17 1055 10     Pain Loc --      Pain Edu? --      Excl. in [place]? --      Constitutional: Alert and oriented. Well appearing overall, but looks like she does not feel well. HEENT   Head: Normocephalic and atraumatic.      Eyes: Conjunctivae are normal. Pupils equal and round.       Ears:         Nose: No congestion/rhinnorhea.   Mouth/Throat: Mucous membranes are moist.   Neck: No stridor. Cardiovascular/Chest: Normal rate, regular rhythm.  No murmurs, rubs, or gallops. Respiratory: Normal respiratory effort without tachypnea nor retractions. Breath sounds are clear and equal bilaterally.  Decreased air movement throughout, mild expiratory wheezing. Gastrointestinal: Soft. No distention, no guarding, no rebound. Nontender.    Genitourinary/rectal:Deferred Musculoskeletal: Nontender with normal range of motion in all extremities. No joint effusions.  No lower extremity tenderness.  No edema. Neurologic:  Normal speech and language. No gross or focal neurologic deficits are  appreciated. Skin:  Skin is warm, dry and intact. No rash noted. Psychiatric: Mood and affect are normal. Speech and behavior are normal. Patient exhibits appropriate insight and judgment.   ____________________________________________  LABS (pertinent positives/negatives) I, Lisa Roca, MD the attending physician have reviewed the labs noted below.  Labs Reviewed  COMPREHENSIVE METABOLIC PANEL - Abnormal; Notable for the following components:      Result Value   Glucose, Bld 121 (*)    Creatinine, Ser 1.05 (*)    GFR calc non Af Amer 60 (*)    All other components within normal limits  CBC WITH DIFFERENTIAL/PLATELET - Abnormal; Notable for the following components:   Lymphs Abs 0.6 (*)    All other components within normal limits  URINALYSIS, COMPLETE (UACMP) WITH MICROSCOPIC - Abnormal; Notable for the following components:  Color, Urine YELLOW (*)    APPearance CLEAR (*)    Hgb urine dipstick LARGE (*)    Squamous Epithelial / LPF 0-5 (*)    All other components within normal limits  INFLUENZA PANEL BY PCR (TYPE A & B) - Abnormal; Notable for the following components:   Influenza A By PCR POSITIVE (*)    All other components within normal limits  CULTURE, BLOOD (ROUTINE X 2)  CULTURE, BLOOD (ROUTINE X 2)  LACTIC ACID, PLASMA  PROTIME-INR  LACTIC ACID, PLASMA     ____________________________________________  RADIOLOGY All Xrays were viewed by me.  Imaging interpreted by Radiologist, and I, Lisa Roca, MD the attending physician have reviewed the radiologist interpretation noted below.  Chest x-ray: No acute infiltrate  Radiologist report:IMPRESSION: No active cardiopulmonary disease. __________________________________________  PROCEDURES  Procedure(s) performed: None  Critical Care performed: None   ____________________________________________  ED COURSE / ASSESSMENT AND PLAN  Pertinent labs & imaging results that were available during my care of  the patient were reviewed by me and considered in my medical decision making (see chart for details).  Patient arrived febrile with hypoxia and found to be flu positive.  Patient requiring supplemental O2 and will need hospitalization.  She does have underlying COPD/asthma and is having some wheezing and I will go ahead and treat her for asthma exacerbation in the setting of influenza A positivity and hypoxia.  No medication for sepsis at this point time without hypotension or tachycardia or elevated lactate.  DIFFERENTIAL DIAGNOSIS: Differential includes, but is not limited to, viral syndrome, bronchitis including COPD exacerbation, pneumonia, reactive airway disease including asthma, CHF including exacerbation with or without pulmonary/interstitial edema, pneumothorax, ACS, thoracic trauma, and pulmonary embolism.  CONSULTATIONS:   Hospitalist for admission, Dr. Doy Hutching.   Patient / Family / Caregiver informed of clinical course, medical decision-making process, and agree with plan.     ___________________________________________   FINAL CLINICAL IMPRESSION(S) / ED DIAGNOSES   Final diagnoses:  Influenza A  Hypoxia      ___________________________________________        Note: This dictation was prepared with Dragon dictation. Any transcriptional errors that result from this process are unintentional    Lisa Roca, MD 12/04/17 1220

## 2017-12-04 NOTE — ED Triage Notes (Signed)
Pt to ED via [facility] from home for shortness of breath and fever that started yesterday. Pt has been using Tylenol without relief. Per EMS pt temp with them was 103.3, pt was given ibuprofen 600 mg. Pt was also given 2 duoneb treatments and Sol-u-medrol 125 mg. EMS reports diminished lung sounds throughout. Pt on 3 liter of O2 via [place], pt is not on O2 at baseline.

## 2017-12-04 NOTE — ED Notes (Signed)
ED Provider at bedside. 

## 2017-12-04 NOTE — H&P (Signed)
History and Physical    Wendy Ramos RXV:[id number] DOB: 04/13/1966 DOA: 12/04/2017  Referring physician: Dr. Reita Cliche PCP: System, Pcp Not In  Specialists: none  Chief Complaint: SOB  HPI: Wendy Ramos is a 52 y.o. female has a past medical history significant for COPD/asthma and renal cancer now with 3-4 day hx of worsening SOB with fever, cough, and pleuritic CP. Hypoxic in ER requiring O2 and Influenza A positive. She is now admitted. Still SOB and c/o CP. Coughing. No N/V/D.  Review of Systems: The patient denies anorexia,  weight loss,, vision loss, decreased hearing, hoarseness, syncope, peripheral edema, balance deficits, hemoptysis, abdominal pain, melena, hematochezia, severe indigestion/heartburn, hematuria, incontinence, genital sores, muscle weakness, suspicious skin lesions, transient blindness, difficulty walking, depression, unusual weight change, abnormal bleeding, enlarged lymph nodes, angioedema, and breast masses.   Past Medical History:  Diagnosis Date  . Asthma   . Cancer [facility])    kidney cancer- 2014;   . COPD (chronic obstructive pulmonary disease) ([place])    Past Surgical History:  Procedure Laterality Date  . ABDOMINAL HYSTERECTOMY    . CHOLECYSTECTOMY    . KIDNEY SURGERY     Social History:  reports that she has been smoking cigarettes.  She has been smoking about 0.50 packs per day. she has never used smokeless tobacco. She reports that she does not drink alcohol or use drugs.  Allergies  Allergen Reactions  . Chicken Allergy Rash  . Chocolate Flavor Hives  . Fish-Derived Products Hives    Allergic to all seafood.  . Metronidazole Hives  . Cefazolin Hives    Developed local urticaria after 1.2gm ancef administration intraoperatively. Medication discontinued, no additional intervention required.  . Iopamidol Hives and Itching    Delayed reaction approx. 20 minutes post contrast.  . Beef-Derived Products Hives  . Fish Allergy Hives  . Galactose Rash   POSSIBLE ALPHA GAL ALLERGY - rash reaction to all beef and chicken    Family History  Problem Relation Age of Onset  . Hypertension Mother   . Heart disease Mother     Prior to Admission medications   Medication Sig Start Date End Date Taking? Authorizing Provider  amLODipine (NORVASC) 10 MG tablet Take 10 mg by mouth daily. 02/24/17   [provider]  benzonatate (TESSALON) 200 MG capsule Take 1 capsule (200 mg total) by mouth 3 (three) times daily. 09/12/17   Bettey Costa, MD  buPROPion (WELLBUTRIN SR) 100 MG 12 hr tablet Take 100 mg by mouth 2 (two) times daily. 06/20/17   [provider]  cetirizine (ZYRTEC) 5 MG tablet Take 5 mg by mouth daily. 07/15/17   [provider]  famotidine (PEPCID) 20 MG tablet Take 20 mg by mouth 2 (two) times daily.     [provider]  hydrochlorothiazide (HYDRODIURIL) 25 MG tablet Take 25 mg by mouth daily. 02/24/17   [provider]  ipratropium-albuterol (DUONEB) 0.5-2.5 (3) MG/3ML SOLN Take 3 mLs by nebulization every 6 (six) hours as needed (sob wheezing). 09/12/17   Bettey Costa, MD  meloxicam (MOBIC) 15 MG tablet Take 15 mg by mouth daily. 06/20/17   [provider]  methocarbamol (ROBAXIN) 500 MG tablet Take 2 tablets (1,000 mg total) by mouth 4 (four) times daily as needed for muscle spasms (muscle spasm/pain). Patient not taking: Reported on 09/10/2017 04/29/17   Francine Graven, DO  mometasone-formoterol [facility]) 200-5 MCG/ACT AERO Inhale 2 puffs into the lungs 2 (two) times daily. 09/12/17 10/12/17  Bettey Costa, MD  nicotine polacrilex (NICORETTE) 2 MG gum Take 2 mg by mouth as needed for smoking cessation.    [provider]  omeprazole (PRILOSEC) 40 MG capsule Take 40 mg by mouth 2 (two) times daily.    [provider]  potassium chloride (K-DUR) 10 MEQ tablet 10 mEq 2 (two) times daily. 03/08/17   [provider]  PROAIR HFA 108 (90 Base) MCG/ACT inhaler Inhale 2 puffs  into the lungs every 6 (six) hours as needed for wheezing. 06/19/17   [provider]   Physical Exam: Vitals:   12/04/17 1054 12/04/17 1055 12/04/17 1103 12/04/17 1127  BP:   (!) 144/89 137/78  Pulse: 100   91  Resp: (!) 22   20  Temp: (!) 102.8 F (39.3 C)     TempSrc: Oral     SpO2: (S) 97%   95%  Weight:  117.5 kg (259 lb)    Height:  5\' 4"  (1.626 m)       General:  WDWN, [place]/AT, in acute distress  Eyes: PERRL, EOMI, no scleral icterus, conjunctiva clear  ENT: moist oropharynx without exudate, TM's benign, dentition good  Neck: supple, no lymphadenopathy. No bruits or thyromegaly  Cardiovascular: regular rate without MRG; 2+ peripheral pulses, no JVD, no peripheral edema  Respiratory: decreased breath sounds with expiratory wheezes. No rales or rhonchi. No dullness. Respiratory effort increased  Abdomen: soft, non tender to palpation, positive bowel sounds, no guarding, no rebound  Skin: no rashes or lesions  Musculoskeletal: normal bulk and tone, no joint swelling  Psychiatric: normal mood and affect, A&OX3  Neurologic: CN 2-12 grossly intact, Motor strength 5/5 in all 4 groups with symmetric DTR's and non-focal sensory exam  Labs on Admission:  Basic Metabolic Panel: Recent Labs  Lab 12/04/17 1059  NA 139  K 3.9  CL 106  CO2 22  GLUCOSE 121*  BUN 11  CREATININE 1.05*  CALCIUM 9.2   Liver Function Tests: Recent Labs  Lab 12/04/17 1059  AST 24  ALT 14  ALKPHOS 85  BILITOT 0.7  PROT 7.9  ALBUMIN 4.4   No results for input(s): LIPASE, AMYLASE in the last 168 hours. No results for input(s): AMMONIA in the last 168 hours. CBC: Recent Labs  Lab 12/04/17 1059  WBC 7.0  NEUTROABS 5.8  HGB 12.2  HCT 36.3  MCV 92.8  PLT 229   Cardiac Enzymes: No results for input(s): CKTOTAL, CKMB, CKMBINDEX, TROPONINI in the last 168 hours.  BNP (last 3 results) No results for input(s): BNP in the last 8760 hours.  ProBNP (last 3 results) No  results for input(s): PROBNP in the last 8760 hours.  CBG: No results for input(s): GLUCAP in the last 168 hours.  Radiological Exams on Admission: Dg Chest 2 View  Result Date: 12/04/2017 CLINICAL DATA:  Shortness of breath with cough and fever since yesterday. EXAM: CHEST  2 VIEW COMPARISON:  10/27/2017 FINDINGS: The heart size and mediastinal contours are within normal limits. Both lungs are clear. The visualized skeletal structures are unremarkable. IMPRESSION: No active cardiopulmonary disease. Electronically Signed   By: Marin Olp M.D.   On: 12/04/2017 11:26    EKG: Independently reviewed.  Assessment/Plan Principal Problem:   Acute respiratory failure (HCC) Active Problems:   Asthma exacerbation   Influenza A   Chest pain   Will admit to floor with O2, IV fluids, IV steroids, SVN's, and empiric IV ABX. Wean O2. Begin Tamiflu. Rpeat labs in AM  Diet: clear liquids  Fluids: NS@100  DVT Prophylaxis: SQ Heparin  Code Status: FULL  Family Communication: yes  Disposition Plan: home  Time spent: 50 min

## 2017-12-05 ENCOUNTER — Inpatient Hospital Stay: Payer: Medicaid Other

## 2017-12-05 LAB — CBC
HCT: 32.5 % — ABNORMAL LOW (ref 35.0–47.0)
HEMOGLOBIN: 10.7 g/dL — AB (ref 12.0–16.0)
MCH: 31.3 pg (ref 26.0–34.0)
MCHC: 33.1 g/dL (ref 32.0–36.0)
MCV: 94.7 fL (ref 80.0–100.0)
PLATELETS: 196 10*3/uL (ref 150–440)
RBC: 3.43 MIL/uL — ABNORMAL LOW (ref 3.80–5.20)
RDW: 14.3 % (ref 11.5–14.5)
WBC: 4.1 10*3/uL (ref 3.6–11.0)

## 2017-12-05 LAB — TROPONIN I

## 2017-12-05 LAB — COMPREHENSIVE METABOLIC PANEL
ALK PHOS: 69 U/L (ref 38–126)
ALT: 13 U/L — AB (ref 14–54)
ANION GAP: 9 (ref 5–15)
AST: 38 U/L (ref 15–41)
Albumin: 3.6 g/dL (ref 3.5–5.0)
BUN: 13 mg/dL (ref 6–20)
CALCIUM: 8.4 mg/dL — AB (ref 8.9–10.3)
CHLORIDE: 110 mmol/L (ref 101–111)
CO2: 20 mmol/L — ABNORMAL LOW (ref 22–32)
CREATININE: 0.91 mg/dL (ref 0.44–1.00)
GFR calc Af Amer: >60 mL/min (ref >60)
Glucose, Bld: 189 mg/dL — ABNORMAL HIGH (ref 65–99)
Potassium: 3.6 mmol/L (ref 3.5–5.1)
SODIUM: 139 mmol/L (ref 135–145)
Total Bilirubin: 0.3 mg/dL (ref 0.3–1.2)
Total Protein: 6.9 g/dL (ref 6.5–8.1)

## 2017-12-05 LAB — GLUCOSE, CAPILLARY: GLUCOSE-CAPILLARY: 139 mg/dL — AB (ref 65–99)

## 2017-12-05 MED ORDER — IOPAMIDOL (ISOVUE-370) INJECTION 76%
75.0000 mL | Freq: Once | INTRAVENOUS | Status: AC | PRN
  Administered 2017-12-05: 75 mL via INTRAVENOUS

## 2017-12-05 MED ORDER — AZITHROMYCIN 250 MG PO TABS
500.0000 mg | ORAL_TABLET | Freq: Every day | ORAL | Status: DC
  Administered 2017-12-05 – 2017-12-06 (×2): 500 mg via ORAL
  Filled 2017-12-05 (×2): qty 2

## 2017-12-05 MED ORDER — IPRATROPIUM-ALBUTEROL 0.5-2.5 (3) MG/3ML IN SOLN
3.0000 mL | Freq: Three times a day (TID) | RESPIRATORY_TRACT | Status: DC
  Administered 2017-12-05 – 2017-12-06 (×3): 3 mL via RESPIRATORY_TRACT
  Filled 2017-12-05 (×3): qty 3

## 2017-12-05 MED ORDER — OXYCODONE-ACETAMINOPHEN 5-325 MG PO TABS
1.0000 | ORAL_TABLET | Freq: Two times a day (BID) | ORAL | Status: DC | PRN
  Administered 2017-12-05 – 2017-12-06 (×2): 1 via ORAL
  Filled 2017-12-05 (×2): qty 1

## 2017-12-05 MED ORDER — METHYLPREDNISOLONE SODIUM SUCC 125 MG IJ SOLR
60.0000 mg | Freq: Every day | INTRAMUSCULAR | Status: DC
  Filled 2017-12-05: qty 2

## 2017-12-05 NOTE — Progress Notes (Signed)
[place] [facility] [place] NAME: Wendy Ramos    MR#:  [id number]  DATE OF BIRTH:  [birth year]  SUBJECTIVE:  CHIEF COMPLAINT:   Chief Complaint  Patient presents with  . Shortness of Breath  . Fever  concerned about weight gain from steroids, very jittery. Would like to eat REVIEW OF SYSTEMS:  Review of Systems  Constitutional: Positive for malaise/fatigue. Negative for chills, fever and weight loss.  HENT: Negative for nosebleeds and sore throat.   Eyes: Negative for blurred vision.  Respiratory: Positive for cough and shortness of breath. Negative for wheezing.   Cardiovascular: Negative for chest pain, orthopnea, leg swelling and PND.  Gastrointestinal: Negative for abdominal pain, constipation, diarrhea, heartburn, nausea and vomiting.  Genitourinary: Negative for dysuria and urgency.  Musculoskeletal: Negative for back pain.  Skin: Negative for rash.  Neurological: Positive for weakness. Negative for dizziness, speech change, focal weakness and headaches.  Endo/Heme/Allergies: Does not bruise/bleed easily.  Psychiatric/Behavioral: Negative for depression.   DRUG ALLERGIES:   Allergies  Allergen Reactions  . Chicken Allergy Rash  . Chocolate Flavor Hives  . Fish-Derived Products Hives    Allergic to all seafood.  . Metronidazole Hives  . Cefazolin Hives    Developed local urticaria after 1.2gm ancef administration intraoperatively. Medication discontinued, no additional intervention required.  . Iopamidol Hives and Itching    Delayed reaction approx. 20 minutes post contrast.  . Beef-Derived Products Hives  . Fish Allergy Hives  . Galactose Rash    POSSIBLE ALPHA GAL ALLERGY - rash reaction to all beef and chicken   VITALS:  Blood pressure (!) 153/82, pulse 82, temperature 99 F (37.2 C), temperature source Oral, resp. rate (!) 24, height 5\' 4"  (1.626 m), weight 119.5 kg (263 lb 6.4 oz), SpO2 95 %. PHYSICAL EXAMINATION:    Physical Exam  Constitutional: She is oriented to person, place, and time and well-developed, well-nourished, and in no distress.  HENT:  Head: Normocephalic and atraumatic.  Eyes: Conjunctivae and EOM are normal. Pupils are equal, round, and reactive to light.  Neck: Normal range of motion. Neck supple. No tracheal deviation present. No thyromegaly present.  Cardiovascular: Normal rate, regular rhythm and normal heart sounds.  Pulmonary/Chest: Effort normal and breath sounds normal. No respiratory distress. She has no wheezes. She exhibits no tenderness.  Abdominal: Soft. Bowel sounds are normal. She exhibits no distension. There is no tenderness.  Musculoskeletal: Normal range of motion.  Neurological: She is alert and oriented to person, place, and time. No cranial nerve deficit.  Skin: Skin is warm and dry. No rash noted.  Psychiatric: Mood and affect normal.   LABORATORY PANEL:  Female CBC Recent Labs  Lab 12/05/17 0017  WBC 4.1  HGB 10.7*  HCT 32.5*  PLT 196   ------------------------------------------------------------------------------------------------------------------ Chemistries  Recent Labs  Lab 12/05/17 0017  NA 139  K 3.6  CL 110  CO2 20*  GLUCOSE 189*  BUN 13  CREATININE 0.91  CALCIUM 8.4*  AST 38  ALT 13*  ALKPHOS 69  BILITOT 0.3   RADIOLOGY:  Ct Angio Chest Pe W Or Wo Contrast  Result Date: 12/05/2017 CLINICAL DATA:  Shortness of breath. 3-4 days of fever, cough, and chest pain. Influenza. EXAM: CT ANGIOGRAPHY CHEST WITH CONTRAST TECHNIQUE: Multidetector CT imaging of the chest was performed using the standard protocol during bolus administration of intravenous contrast. Multiplanar CT image reconstructions and MIPs were obtained to evaluate the vascular anatomy. CONTRAST:  72mL  ISOVUE-370 IOPAMIDOL (ISOVUE-370) INJECTION 76% COMPARISON:  None. FINDINGS: Cardiovascular: Satisfactory opacification of the pulmonary arteries to the segmental level. No  evidence of pulmonary embolism. Normal heart size. No pericardial effusion. Mediastinum/Nodes: Negative for adenopathy or mass Lungs/Pleura: There is patchy ground-glass opacities in the right upper lobe and atelectasis in the right lower and middle lobes. Mild generalized airway thickening. Upper Abdomen: No acute finding Musculoskeletal: No acute finding. Review of the MIP images confirms the above findings. IMPRESSION: 1. Negative for pulmonary embolism. 2. Mild ground-glass opacity in the right upper lobe likely reflecting pneumonitis/pneumonia in this clinical setting. Mild right middle and lower lobe atelectasis. Electronically Signed   By: Monte Fantasia M.D.   On: 12/05/2017 07:47   ASSESSMENT AND PLAN:  [name] is a 52 y.o. female has a past medical history significant for COPD/asthma and renal cancer now with 3-4 day hx of worsening SOB with fever, cough, and pleuritic CP. Hypoxic in ER requiring O2 and Influenza A positive. She is now admitted. Still SOB and c/o CP. Coughing. No N/V/D  * Acute hypoxic respiratory failure  - requiring 3 liters O2 via [place]  * Asthma exacerbation - taper IV steroids to once daily (was on 4 times day)  * Influenza A - Tamiflu for 5 days  * Chest pain: pleuritic from bronchitis/flu  * HTN - continue Norvasc and HCTZ       All the records are reviewed and case discussed with Care Management/Social Worker. Management plans discussed with the patient, nursing and they are in agreement.  CODE STATUS: Full Code  TOTAL TIME TAKING CARE OF THIS PATIENT: 25 minutes.   More than 50% of the time was spent in counseling/coordination of care: YES  POSSIBLE D/C IN 1-2 DAYS, DEPENDING ON CLINICAL CONDITION.   Max Sane M.D on 12/05/2017 at 12:13 PM  Between 7am to 6pm - Pager - [phone]  After 6pm go to [email or web address] - Proofreader  [facility]  Office  [phone]  CC: Primary care physician; System, Pcp  Not In  Note: This dictation was prepared with Dragon dictation along with smaller phrase technology. Any transcriptional errors that result from this process are unintentional.

## 2017-12-05 NOTE — Consult Note (Signed)
PHARMACIST - PHYSICIAN COMMUNICATION DR:   Manuella Ghazi CONCERNING: Antibiotic IV to Oral Route Change Policy  RECOMMENDATION: This patient is receiving azithromycin by the intravenous route.  Based on criteria approved by the Pharmacy and Therapeutics Committee, the antibiotic(s) is/are being converted to the equivalent oral dose form(s).   DESCRIPTION: These criteria include:  Patient being treated for a respiratory tract infection, urinary tract infection, cellulitis or clostridium difficile associated diarrhea if on metronidazole  The patient is not neutropenic and does not exhibit a GI malabsorption state  The patient is eating (either orally or via tube) and/or has been taking other orally administered medications for a least 24 hours  The patient is improving clinically and has a Tmax < 100.5  If you have questions about this conversion, please contact the Pharmacy Department  []   [phone] )  [facility] []   [phone] )  [facility] []   [phone] )  [facility] []   [phone] )  [facility] []   [phone] )  [facility]

## 2017-12-05 NOTE — Progress Notes (Signed)
Notified Dr Duane Boston of CT prep orders with prednisone as well as solumedrol orders q6h, OK to continue both.

## 2017-12-05 NOTE — Progress Notes (Signed)
Dr. Duane Boston notified of patient's request to restart home pain medication: Percocet 5-325mg , one twice a day, PRN; acknowledged; new order written. Barbaraann Faster, RN 9:32 PM 12/05/2017

## 2017-12-06 LAB — BASIC METABOLIC PANEL
ANION GAP: 9 (ref 5–15)
BUN: 20 mg/dL (ref 6–20)
CO2: 24 mmol/L (ref 22–32)
Calcium: 8.4 mg/dL — ABNORMAL LOW (ref 8.9–10.3)
Chloride: 106 mmol/L (ref 101–111)
Creatinine, Ser: 1.13 mg/dL — ABNORMAL HIGH (ref 0.44–1.00)
GFR calc Af Amer: >60 mL/min (ref >60)
GFR calc non Af Amer: 54 mL/min — ABNORMAL LOW (ref >60)
GLUCOSE: 130 mg/dL — AB (ref 65–99)
Potassium: 3.9 mmol/L (ref 3.5–5.1)
Sodium: 139 mmol/L (ref 135–145)

## 2017-12-06 LAB — CBC
HEMATOCRIT: 33.1 % — AB (ref 35.0–47.0)
Hemoglobin: 10.8 g/dL — ABNORMAL LOW (ref 12.0–16.0)
MCH: 30.8 pg (ref 26.0–34.0)
MCHC: 32.6 g/dL (ref 32.0–36.0)
MCV: 94.5 fL (ref 80.0–100.0)
Platelets: 208 10*3/uL (ref 150–440)
RBC: 3.5 MIL/uL — AB (ref 3.80–5.20)
RDW: 14.5 % (ref 11.5–14.5)
WBC: 13.7 10*3/uL — AB (ref 3.6–11.0)

## 2017-12-06 LAB — GLUCOSE, CAPILLARY: Glucose-Capillary: 95 mg/dL (ref 65–99)

## 2017-12-06 MED ORDER — IBUPROFEN 600 MG PO TABS
600.0000 mg | ORAL_TABLET | Freq: Once | ORAL | Status: AC
  Administered 2017-12-06: 600 mg via ORAL
  Filled 2017-12-06: qty 1

## 2017-12-06 MED ORDER — IBUPROFEN 400 MG PO TABS: 400.0000 mg | ORAL_TABLET | Freq: Four times a day (QID) | ORAL | 0 refills | Status: DC | PRN

## 2017-12-06 MED ORDER — OSELTAMIVIR PHOSPHATE 75 MG PO CAPS: 75.0000 mg | ORAL_CAPSULE | Freq: Two times a day (BID) | ORAL | 0 refills | Status: DC

## 2017-12-06 MED ORDER — IBUPROFEN 400 MG PO TABS: 400.0000 mg | ORAL_TABLET | Freq: Four times a day (QID) | ORAL | Status: DC | PRN

## 2017-12-06 NOTE — Progress Notes (Signed)
12/06/2017 1:30 PM  Wendy Ramos to be D/C'd Home per MD order.  Discussed prescriptions and follow up appointments with the patient. Prescriptions given to patient, medication list explained in detail. Pt verbalized understanding.    Skin clean, dry and intact without evidence of skin break down, no evidence of skin tears noted. IV catheter discontinued intact. Site without signs and symptoms of complications. Dressing and pressure applied. Pt denies pain at this time. No complaints noted.  An After Visit Summary was printed and given to the patient. Patient escorted via [place], and D/C home via private auto.  Dola Argyle

## 2017-12-09 ENCOUNTER — Encounter: Payer: Self-pay | Admitting: Emergency Medicine

## 2017-12-09 ENCOUNTER — Emergency Department: Payer: Medicaid Other

## 2017-12-09 ENCOUNTER — Emergency Department
Admission: EM | Admit: 2017-12-09 | Discharge: 2017-12-09 | Disposition: A | Discharge status: Home / Self Care | Payer: Medicaid Other | Attending: Emergency Medicine | Admitting: Emergency Medicine

## 2017-12-09 DIAGNOSIS — J449 Chronic obstructive pulmonary disease, unspecified: Secondary | ICD-10-CM | POA: Insufficient documentation

## 2017-12-09 DIAGNOSIS — M79671 Pain in right foot: Secondary | ICD-10-CM | POA: Insufficient documentation

## 2017-12-09 DIAGNOSIS — F17210 Nicotine dependence, cigarettes, uncomplicated: Secondary | ICD-10-CM | POA: Diagnosis not present

## 2017-12-09 DIAGNOSIS — Z85528 Personal history of other malignant neoplasm of kidney: Secondary | ICD-10-CM | POA: Insufficient documentation

## 2017-12-09 DIAGNOSIS — J45909 Unspecified asthma, uncomplicated: Secondary | ICD-10-CM | POA: Insufficient documentation

## 2017-12-09 DIAGNOSIS — F1210 Cannabis abuse, uncomplicated: Secondary | ICD-10-CM | POA: Insufficient documentation

## 2017-12-09 DIAGNOSIS — R2241 Localized swelling, mass and lump, right lower limb: Secondary | ICD-10-CM | POA: Diagnosis not present

## 2017-12-09 DIAGNOSIS — M79672 Pain in left foot: Secondary | ICD-10-CM

## 2017-12-09 DIAGNOSIS — Z79899 Other long term (current) drug therapy: Secondary | ICD-10-CM | POA: Insufficient documentation

## 2017-12-09 LAB — CULTURE, BLOOD (ROUTINE X 2)
CULTURE: NO GROWTH
Culture: NO GROWTH
Special Requests: ADEQUATE
Special Requests: ADEQUATE

## 2017-12-09 NOTE — ED Provider Notes (Signed)
[facility] Emergency Department Provider Note  ____________________________________________   First MD Initiated Contact with Patient 12/09/17 1133     (approximate)  I have reviewed the triage vital signs and the nursing notes.   HISTORY  Chief Complaint Foot Swelling    HPI Wendy Ramos is a 52 y.o. female presents emergency department complaining of right foot pain.  She states is been throbbing and swelling on the lateral aspect.  She was recently discharged from the hospital.  She had had acute respiratory distress with her asthma and also had the flu.  She was given blood thinners while in the hospital to avoid blood clots.  She denies any leg pain.  Denies injury to the right foot  Past Medical History:  Diagnosis Date  . Asthma   . Cancer [facility])    kidney cancer- 2014;   . COPD (chronic obstructive pulmonary disease) [facility])     Patient Active Problem List   Diagnosis Date Noted  . Influenza A 12/04/2017  . Acute respiratory failure ([place]) 12/04/2017  . Chest pain 12/04/2017  . Asthma exacerbation 09/10/2017    Past Surgical History:  Procedure Laterality Date  . ABDOMINAL HYSTERECTOMY    . CHOLECYSTECTOMY    . KIDNEY SURGERY      Prior to Admission medications   Medication Sig Start Date End Date Taking? Authorizing Provider  amLODipine (NORVASC) 10 MG tablet Take 10 mg by mouth daily. 02/24/17   [provider]  benzonatate (TESSALON) 200 MG capsule Take 1 capsule (200 mg total) by mouth 3 (three) times daily. 09/12/17   Bettey Costa, MD  cetirizine (ZYRTEC) 10 MG tablet Take 10 mg by mouth daily.  07/15/17   [provider]  famotidine (PEPCID) 20 MG tablet Take 20 mg by mouth 2 (two) times daily.     [provider]  ibuprofen (ADVIL,MOTRIN) 400 MG tablet Take 1 tablet (400 mg total) by mouth every 6 (six) hours as needed for headache. 12/06/17   Max Sane, MD  ipratropium-albuterol (DUONEB) 0.5-2.5 (3) MG/3ML  SOLN Take 3 mLs by nebulization every 6 (six) hours as needed (sob wheezing). 09/12/17   Bettey Costa, MD  methocarbamol (ROBAXIN) 500 MG tablet Take 2 tablets (1,000 mg total) by mouth 4 (four) times daily as needed for muscle spasms (muscle spasm/pain). Patient not taking: Reported on 09/10/2017 04/29/17   Francine Graven, DO  mometasone-formoterol [facility]) 200-5 MCG/ACT AERO Inhale 2 puffs into the lungs 2 (two) times daily. 09/12/17 12/04/18  Bettey Costa, MD  nicotine polacrilex (NICORETTE) 2 MG gum Take 2 mg by mouth as needed for smoking cessation.    [provider]  omeprazole (PRILOSEC) 40 MG capsule Take 40 mg by mouth 2 (two) times daily.    [provider]  oseltamivir (TAMIFLU) 75 MG capsule Take 1 capsule (75 mg total) by mouth 2 (two) times daily. 12/06/17   Max Sane, MD  oxyCODONE-acetaminophen (PERCOCET/ROXICET) 5-325 MG tablet Take 1 tablet by mouth 2 (two) times daily as needed for severe pain (chronic back pain).    [provider]  potassium chloride (K-DUR) 10 MEQ tablet 10 mEq 2 (two) times daily. 03/08/17   [provider]  PROAIR HFA 108 (90 Base) MCG/ACT inhaler Inhale 2 puffs into the lungs every 6 (six) hours as needed for wheezing. 06/19/17   [provider]    Allergies Chicken allergy; Chocolate flavor; Fish-derived products; Metronidazole; Cefazolin; Iopamidol; Beef-derived products; Fish allergy; and Galactose  Family History  Problem Relation Age of Onset  . Hypertension Mother   . Heart disease Mother     Social History Social History   Tobacco Use  . Smoking status: Current Every Day Smoker    Packs/day: 0.50    Types: Cigarettes  . Smokeless tobacco: Never Used  . Tobacco comment: states she smokes marijuana, Will give cessation materials upon return  Substance Use Topics  . Alcohol use: No  . Drug use: No    Review of Systems  Constitutional: No fever/chills Eyes: No visual changes. ENT: No sore  throat. Respiratory: Denies cough Genitourinary: Negative for dysuria. Musculoskeletal: Negative for back pain.  Positive for right foot pain Skin: Negative for rash.    ____________________________________________   PHYSICAL EXAM:  VITAL SIGNS: ED Triage Vitals  Enc Vitals Group     BP 12/09/17 1059 132/84     Pulse Rate 12/09/17 1059 74     Resp 12/09/17 1059 18     Temp 12/09/17 1059 98.5 F (36.9 C)     Temp Source 12/09/17 1059 Oral     SpO2 12/09/17 1059 98 %     Weight 12/09/17 1100 266 lb (120.7 kg)     Height 12/09/17 1100 5\' 4"  (1.626 m)     Head Circumference --      Peak Flow --      Pain Score 12/09/17 1100 8     Pain Loc --      Pain Edu? --      Excl. in [place]? --     Constitutional: Alert and oriented. Well appearing and in no acute distress. Eyes: Conjunctivae are normal.  Head: Atraumatic. Nose: No congestion/rhinnorhea. Mouth/Throat: Mucous membranes are moist.   Cardiovascular: Normal rate, regular rhythm.  Heart sounds are normal Respiratory: Normal respiratory effort.  No retractions, lungs clear to auscultation GU: deferred Musculoskeletal: FROM all extremities, warm and well perfused.  The right foot is tender along the fifth metatarsal.  She has full range of motion.  The calf is not tender.  She has negative Homans sign.  Neurovascular is intact.  Cap refill is less than 1 second Neurologic:  Normal speech and language.  Skin:  Skin is warm, dry and intact. No rash noted. Psychiatric: Mood and affect are normal. Speech and behavior are normal.  ____________________________________________   LABS (all labs ordered are listed, but only abnormal results are displayed)  Labs Reviewed - No data to display ____________________________________________   ____________________________________________  RADIOLOGY  X-ray of the right foot is negative  ____________________________________________   PROCEDURES  Procedure(s) performed:  No  Procedures    ____________________________________________   INITIAL IMPRESSION / ASSESSMENT AND PLAN / ED COURSE  Pertinent labs & imaging results that were available during my care of the patient were reviewed by me and considered in my medical decision making (see chart for details).  Patient is 53 year old female presents emergency department complaining of right foot pain.  She was released from the hospital on 2/19.  She states she did get blood thinners to prevent blood clots.  She denies any injury  On physical exam the right foot is little tender along the fifth metatarsal.  There is very mild swelling.  Calf is nontender  X-ray of the right foot is negative for fracture  X-ray results were explained to the patient.  Tolerated soft tissue swelling and injury.  She should wear the Ace wrap that we have provided for extra support.  Take over-the-counter Tylenol.  Elevate foot  and ice it.  If she is worsening she is to return to the emergency department.  Told her she is at very low risk for blood clot as she had Lovenox while in the hospital.  She states she understands comply with our instructions.  She was discharged in stable condition     As part of my medical decision making, I reviewed the following data within the [place] notes reviewed and incorporated, Radiograph reviewed x-ray of the right foot is negative, old chart was reviewed with hospitalization.  Notes from prior ED visits and [place] Controlled Substance Database  ____________________________________________   FINAL CLINICAL IMPRESSION(S) / ED DIAGNOSES  Final diagnoses:  Acute foot pain, left      NEW MEDICATIONS STARTED DURING THIS VISIT:  Discharge Medication List as of 12/09/2017 12:44 PM       Note:  This document was prepared using Dragon voice recognition software and may include unintentional dictation errors.    Versie Starks, PA-C 12/09/17 1610     Arta Silence, MD 12/09/17 [phone]

## 2017-12-09 NOTE — Discharge Instructions (Signed)
Follow-up with your regular doctor if not better in 3-5 days.  Use the Ace wrap as needed for the swelling and pain.  Is to give you extra support.  Apply ice to the area to help with the pain.  Return to emergency department if it is worsening

## 2017-12-09 NOTE — ED Triage Notes (Signed)
Pt comes into the ED via POV c/o "knots on my foot and knee".  Patient has minimal swelling area on the right medial knee and the outer foot.  Denies injuring the areas and no other swelling is present.  Patient in NAd at this time with even and unlabored respirations and is ambulatory into triage.

## 2017-12-09 NOTE — ED Notes (Signed)
Pt c/o sudden onset throbbing and swelling to lateral aspect of R foot that started last night. Pt denies any known injury. Minimal swelling noted to pt's foot. Cap refill < 3 seconds, +pedal pulse, pt maintains sensation and movement at this time.

## 2017-12-11 NOTE — Discharge Summary (Signed)
[facility] NAME: Wendy Ramos    MR#:  [id number]  DATE OF BIRTH:  1966/07/16  DATE OF ADMISSION:  12/04/2017   ADMITTING PHYSICIAN: Idelle Crouch, MD  DATE OF DISCHARGE: 12/06/2017  1:40 PM  PRIMARY CARE PHYSICIAN: Wendy Ramos, Wendy Laws, MD   ADMISSION DIAGNOSIS:  Influenza A [J10.1] Hypoxia [R09.02] DISCHARGE DIAGNOSIS:  Principal Problem:   Acute respiratory failure (HCC) Active Problems:   Asthma exacerbation   Influenza A   Chest pain  SECONDARY DIAGNOSIS:   Past Medical History:  Diagnosis Date  . Asthma   . Cancer [facility])    kidney cancer- 2014;   . COPD (chronic obstructive pulmonary disease) [facility])    HOSPITAL COURSE:  Wendy Ramos a 52 y.o.femalehas a past medical history significant forCOPD/asthma and renal cancer admitted with 3-4 day hx of worsening SOB with fever, cough, and pleuritic CP. Hypoxic in ER requiring O2 and Influenza A positive. She is now admitted. Still SOB and c/o CP. Coughing. No N/V/D  * Acute hypoxic respiratory failure: resolved. - due to asthma from Influenza.  *Asthma exacerbation - improved with nebs and steroids  *Influenza A - Tamiflu for 5 days  *Chest pain: pleuritic from bronchitis/flu - CT chest neg for PE  * HTN - continue Norvasc and HCTZ DISCHARGE CONDITIONS:  stable CONSULTS OBTAINED:   DRUG ALLERGIES:   Allergies  Allergen Reactions  . Chicken Allergy Rash  . Chocolate Flavor Hives  . Fish-Derived Products Hives    Allergic to all seafood.  . Metronidazole Hives  . Cefazolin Hives    Developed local urticaria after 1.2gm ancef administration intraoperatively. Medication discontinued, no additional intervention required.  . Iopamidol Hives and Itching    Delayed reaction approx. 20 minutes post contrast.  . Beef-Derived Products Hives  . Fish Allergy Hives  . Galactose Rash    POSSIBLE ALPHA GAL ALLERGY - rash reaction to all beef and  chicken   DISCHARGE MEDICATIONS:   Allergies as of 12/06/2017      Reactions   Chicken Allergy Rash   Chocolate Flavor Hives   Fish-derived Products Hives   Allergic to all seafood.   Metronidazole Hives   Cefazolin Hives   Developed local urticaria after 1.2gm ancef administration intraoperatively. Medication discontinued, no additional intervention required.   Iopamidol Hives, Itching   Delayed reaction approx. 20 minutes post contrast.   Beef-derived Products Hives   Fish Allergy Hives   Galactose Rash   POSSIBLE ALPHA GAL ALLERGY - rash reaction to all beef and chicken      Medication List    TAKE these medications   amLODipine 10 MG tablet Commonly known as:  NORVASC Take 10 mg by mouth daily.   benzonatate 200 MG capsule Commonly known as:  TESSALON Take 1 capsule (200 mg total) by mouth 3 (three) times daily.   cetirizine 10 MG tablet Commonly known as:  ZYRTEC Take 10 mg by mouth daily.   famotidine 20 MG tablet Commonly known as:  PEPCID Take 20 mg by mouth 2 (two) times daily.   ibuprofen 400 MG tablet Commonly known as:  ADVIL,MOTRIN Take 1 tablet (400 mg total) by mouth every 6 (six) hours as needed for headache.   ipratropium-albuterol 0.5-2.5 (3) MG/3ML Soln Commonly known as:  DUONEB Take 3 mLs by nebulization every 6 (six) hours as needed (sob wheezing).   methocarbamol 500 MG tablet Commonly known as:  ROBAXIN Take 2 tablets (1,000  mg total) by mouth 4 (four) times daily as needed for muscle spasms (muscle spasm/pain).   mometasone-formoterol 200-5 MCG/ACT Aero Commonly known as:  DULERA Inhale 2 puffs into the lungs 2 (two) times daily.   nicotine polacrilex 2 MG gum Commonly known as:  NICORETTE Take 2 mg by mouth as needed for smoking cessation.   omeprazole 40 MG capsule Commonly known as:  PRILOSEC Take 40 mg by mouth 2 (two) times daily.   oseltamivir 75 MG capsule Commonly known as:  TAMIFLU Take 1 capsule (75 mg total) by mouth  2 (two) times daily.   oxyCODONE-acetaminophen 5-325 MG tablet Commonly known as:  PERCOCET/ROXICET Take 1 tablet by mouth 2 (two) times daily as needed for severe pain (chronic back pain).   potassium chloride 10 MEQ tablet Commonly known as:  K-DUR 10 mEq 2 (two) times daily.   PROAIR HFA 108 (90 Base) MCG/ACT inhaler Generic drug:  albuterol Inhale 2 puffs into the lungs every 6 (six) hours as needed for wheezing.        DISCHARGE INSTRUCTIONS:   DIET:  Cardiac diet DISCHARGE CONDITION:  Stable ACTIVITY:  Activity as tolerated OXYGEN:  Home Oxygen: No.  Oxygen Delivery: room air DISCHARGE LOCATION:  home   If you experience worsening of your admission symptoms, develop shortness of breath, life threatening emergency, suicidal or homicidal thoughts you must seek medical attention immediately by calling 911 or calling your MD immediately  if symptoms less severe.  You Must read complete instructions/literature along with all the possible adverse reactions/side effects for all the Medicines you take and that have been prescribed to you. Take any new Medicines after you have completely understood and accpet all the possible adverse reactions/side effects.   Please note  You were cared for by a hospitalist during your hospital stay. If you have any questions about your discharge medications or the care you received while you were in the hospital after you are discharged, you can call the unit and asked to speak with the hospitalist on call if the hospitalist that took care of you is not available. Once you are discharged, your primary care physician will handle any further medical issues. Please note that NO REFILLS for any discharge medications will be authorized once you are discharged, as it is imperative that you return to your primary care physician (or establish a relationship with a primary care physician if you do not have one) for your aftercare needs so that they can  reassess your need for medications and monitor your lab values.    On the day of Discharge:  VITAL SIGNS:  Blood pressure 129/88, pulse 67, temperature 98.1 F (36.7 C), temperature source Oral, resp. rate 14, height 5\' 4"  (1.626 m), weight 120.7 kg (266 lb 1.5 oz), SpO2 99 %. PHYSICAL EXAMINATION:  GENERAL:  52 y.o.-year-old patient lying in the bed with no acute distress.  EYES: Pupils equal, round, reactive to light and accommodation. No scleral icterus. Extraocular muscles intact.  HEENT: Head atraumatic, normocephalic. Oropharynx and nasopharynx clear.  NECK:  Supple, no jugular venous distention. No thyroid enlargement, no tenderness.  LUNGS: Normal breath sounds bilaterally, no wheezing, rales,rhonchi or crepitation. No use of accessory muscles of respiration.  CARDIOVASCULAR: S1, S2 normal. No murmurs, rubs, or gallops.  ABDOMEN: Soft, non-tender, non-distended. Bowel sounds present. No organomegaly or mass.  EXTREMITIES: No pedal edema, cyanosis, or clubbing.  NEUROLOGIC: Cranial nerves II through XII are intact. Muscle strength 5/5 in all extremities. Sensation intact.  Gait not checked.  PSYCHIATRIC: The patient is alert and oriented x 3.  SKIN: No obvious rash, lesion, or ulcer.  DATA REVIEW:   CBC Recent Labs  Lab 12/06/17 0346  WBC 13.7*  HGB 10.8*  HCT 33.1*  PLT 208    Chemistries  Recent Labs  Lab 12/05/17 0017 12/06/17 0346  NA 139 139  K 3.6 3.9  CL 110 106  CO2 20* 24  GLUCOSE 189* 130*  BUN 13 20  CREATININE 0.91 1.13*  CALCIUM 8.4* 8.4*  AST 38  --   ALT 13*  --   ALKPHOS 69  --   BILITOT 0.3  --     Follow-up Information    Wendy Ramos, Wendy Laws, MD. Go on 12/08/2017.   Specialty:  Internal Medicine Why:  Thursday at 8:10am for hospital follow-up Contact information: [place] [address] [phone]            Management plans discussed with the patient, family and they are in agreement.  CODE STATUS: Prior   TOTAL  TIME TAKING CARE OF THIS PATIENT: 45 minutes.    Max Sane M.D on 12/11/2017 at 11:46 AM  Between 7am to 6pm - Pager - [phone]  After 6pm go to [email or web address] - Proofreader  [facility]  Office  [phone]  CC: Primary care physician; Wendy Ramos, Wendy Laws, MD   Note: This dictation was prepared with Dragon dictation along with smaller phrase technology. Any transcriptional errors that result from this process are unintentional.

## 2018-03-30 DIAGNOSIS — E876 Hypokalemia: Secondary | ICD-10-CM | POA: Insufficient documentation

## 2018-07-02 ENCOUNTER — Encounter: Payer: Self-pay | Admitting: Emergency Medicine

## 2018-07-02 ENCOUNTER — Emergency Department: Payer: Medicaid Other

## 2018-07-02 ENCOUNTER — Other Ambulatory Visit: Payer: Self-pay

## 2018-07-02 ENCOUNTER — Emergency Department
Admission: EM | Admit: 2018-07-02 | Discharge: 2018-07-02 | Disposition: A | Discharge status: Home / Self Care | Payer: Medicaid Other | Attending: Emergency Medicine | Admitting: Emergency Medicine

## 2018-07-02 DIAGNOSIS — F17210 Nicotine dependence, cigarettes, uncomplicated: Secondary | ICD-10-CM | POA: Diagnosis not present

## 2018-07-02 DIAGNOSIS — J45909 Unspecified asthma, uncomplicated: Secondary | ICD-10-CM | POA: Insufficient documentation

## 2018-07-02 DIAGNOSIS — Z79899 Other long term (current) drug therapy: Secondary | ICD-10-CM | POA: Diagnosis not present

## 2018-07-02 DIAGNOSIS — J40 Bronchitis, not specified as acute or chronic: Secondary | ICD-10-CM | POA: Insufficient documentation

## 2018-07-02 DIAGNOSIS — R0602 Shortness of breath: Secondary | ICD-10-CM | POA: Diagnosis present

## 2018-07-02 LAB — CBC
HCT: 37.2 % (ref 35.0–47.0)
HEMOGLOBIN: 12.2 g/dL (ref 12.0–16.0)
MCH: 31.2 pg (ref 26.0–34.0)
MCHC: 32.8 g/dL (ref 32.0–36.0)
MCV: 95 fL (ref 80.0–100.0)
Platelets: 253 10*3/uL (ref 150–440)
RBC: 3.92 MIL/uL (ref 3.80–5.20)
RDW: 14.1 % (ref 11.5–14.5)
WBC: 6.1 10*3/uL (ref 3.6–11.0)

## 2018-07-02 LAB — BASIC METABOLIC PANEL
ANION GAP: 11 (ref 5–15)
BUN: 14 mg/dL (ref 6–20)
CALCIUM: 9.6 mg/dL (ref 8.9–10.3)
CO2: 24 mmol/L (ref 22–32)
Chloride: 106 mmol/L (ref 98–111)
Creatinine, Ser: 1.11 mg/dL — ABNORMAL HIGH (ref 0.44–1.00)
GFR, EST NON AFRICAN AMERICAN: 56 mL/min — AB (ref >60)
GLUCOSE: 168 mg/dL — AB (ref 70–99)
Potassium: 3.6 mmol/L (ref 3.5–5.1)
Sodium: 141 mmol/L (ref 135–145)

## 2018-07-02 LAB — TROPONIN I

## 2018-07-02 MED ORDER — HYDROCOD POLST-CPM POLST ER 10-8 MG/5ML PO SUER: 5.0000 mL | Freq: Every evening | ORAL | 0 refills | Status: AC | PRN

## 2018-07-02 MED ORDER — DOXYCYCLINE HYCLATE 100 MG PO CAPS: 100.0000 mg | ORAL_CAPSULE | Freq: Two times a day (BID) | ORAL | 0 refills | Status: AC

## 2018-07-02 MED ORDER — PREDNISONE 20 MG PO TABS: 60.0000 mg | ORAL_TABLET | Freq: Every day | ORAL | 0 refills | Status: AC

## 2018-07-02 MED ORDER — PREDNISONE 20 MG PO TABS: 60.0000 mg | ORAL_TABLET | Freq: Every day | ORAL | 0 refills | Status: DC

## 2018-07-02 MED ORDER — IPRATROPIUM-ALBUTEROL 0.5-2.5 (3) MG/3ML IN SOLN
6.0000 mL | Freq: Once | RESPIRATORY_TRACT | Status: AC
  Administered 2018-07-02: 6 mL via RESPIRATORY_TRACT
  Filled 2018-07-02: qty 6

## 2018-07-02 MED ORDER — DOXYCYCLINE HYCLATE 100 MG PO TABS
100.0000 mg | ORAL_TABLET | Freq: Once | ORAL | Status: AC
  Administered 2018-07-02: 100 mg via ORAL
  Filled 2018-07-02: qty 1

## 2018-07-02 MED ORDER — PREDNISONE 20 MG PO TABS
60.0000 mg | ORAL_TABLET | Freq: Once | ORAL | Status: AC
  Administered 2018-07-02: 60 mg via ORAL
  Filled 2018-07-02: qty 3

## 2018-07-02 NOTE — ED Provider Notes (Signed)
[facility] Emergency Department Provider Note  ____________________________________________  Time seen: Approximately 3:09 PM  I have reviewed the triage vital signs and the nursing notes.   HISTORY  Chief Complaint Chest Pain and Shortness of Breath   HPI Wendy Ramos is a 52 y.o. female with a history of asthma and COPD who presents for evaluation of shortness of breath and wheezing.  Patient reports 2 days of cough productive of yellow sputum, chills, subjective fever, progressively worsening SOB and wheezing.  She is also complaining of tightness sensation in the center of her chest that is only present when she coughs.  She has been using her inhalers at home.  She still smokes.  She denies vomiting or diarrhea, hemoptysis, personal or family history of blood clots, recent travel immobilization, leg pain or swelling, or exogenous hormones.  Patient does have a history of renal cancer status post resection in 2014 with no need for chemoradiation therapy.  Past Medical History:  Diagnosis Date  . Asthma   . Cancer [facility])    kidney cancer- 2014;   . COPD (chronic obstructive pulmonary disease) [facility])     Patient Active Problem List   Diagnosis Date Noted  . Influenza A 12/04/2017  . Acute respiratory failure ([place]) 12/04/2017  . Chest pain 12/04/2017  . Asthma exacerbation 09/10/2017    Past Surgical History:  Procedure Laterality Date  . ABDOMINAL HYSTERECTOMY    . CHOLECYSTECTOMY    . KIDNEY SURGERY      Prior to Admission medications   Medication Sig Start Date End Date Taking? Authorizing Provider  amLODipine (NORVASC) 10 MG tablet Take 10 mg by mouth daily. 02/24/17   [provider]  benzonatate (TESSALON) 200 MG capsule Take 1 capsule (200 mg total) by mouth 3 (three) times daily. 09/12/17   Bettey Costa, MD  cetirizine (ZYRTEC) 10 MG tablet Take 10 mg by mouth daily.  07/15/17   [provider]  doxycycline (VIBRAMYCIN) 100  MG capsule Take 1 capsule (100 mg total) by mouth 2 (two) times daily for 7 days. 07/02/18 07/09/18  Rudene Re, MD  famotidine (PEPCID) 20 MG tablet Take 20 mg by mouth 2 (two) times daily.     [provider]  ibuprofen (ADVIL,MOTRIN) 400 MG tablet Take 1 tablet (400 mg total) by mouth every 6 (six) hours as needed for headache. 12/06/17   Max Sane, MD  ipratropium-albuterol (DUONEB) 0.5-2.5 (3) MG/3ML SOLN Take 3 mLs by nebulization every 6 (six) hours as needed (sob wheezing). 09/12/17   Bettey Costa, MD  methocarbamol (ROBAXIN) 500 MG tablet Take 2 tablets (1,000 mg total) by mouth 4 (four) times daily as needed for muscle spasms (muscle spasm/pain). Patient not taking: Reported on 09/10/2017 04/29/17   Francine Graven, DO  mometasone-formoterol [facility]) 200-5 MCG/ACT AERO Inhale 2 puffs into the lungs 2 (two) times daily. 09/12/17 12/04/18  Bettey Costa, MD  nicotine polacrilex (NICORETTE) 2 MG gum Take 2 mg by mouth as needed for smoking cessation.    [provider]  omeprazole (PRILOSEC) 40 MG capsule Take 40 mg by mouth 2 (two) times daily.    [provider]  oseltamivir (TAMIFLU) 75 MG capsule Take 1 capsule (75 mg total) by mouth 2 (two) times daily. 12/06/17   Max Sane, MD  oxyCODONE-acetaminophen (PERCOCET/ROXICET) 5-325 MG tablet Take 1 tablet by mouth 2 (two) times daily as needed for severe pain (chronic back pain).    [provider]  potassium chloride (K-DUR)  10 MEQ tablet 10 mEq 2 (two) times daily. 03/08/17   [provider]  predniSONE (DELTASONE) 20 MG tablet Take 3 tablets (60 mg total) by mouth daily for 4 days. 07/02/18 07/06/18  Rudene Re, MD  PROAIR HFA 108 [phone] Base) MCG/ACT inhaler Inhale 2 puffs into the lungs every 6 (six) hours as needed for wheezing. 06/19/17   [provider]    Allergies Chicken allergy; Chocolate flavor; Fish-derived products; Metronidazole; Cefazolin; Iopamidol; Beef-derived  products; Fish allergy; and Galactose  Family History  Problem Relation Age of Onset  . Hypertension Mother   . Heart disease Mother     Social History Social History   Tobacco Use  . Smoking status: Current Every Day Smoker    Packs/day: 0.50    Types: Cigarettes  . Smokeless tobacco: Never Used  . Tobacco comment: states she smokes marijuana, Will give cessation materials upon return  Substance Use Topics  . Alcohol use: No  . Drug use: No    Review of Systems  Constitutional: + subjective fever and chills Eyes: Negative for visual changes. ENT: Negative for sore throat. Neck: No neck pain  Cardiovascular: + chest tightness Respiratory: + shortness of breath, cough Gastrointestinal: Negative for abdominal pain, vomiting or diarrhea. Genitourinary: Negative for dysuria. Musculoskeletal: Negative for back pain. Skin: Negative for rash. Neurological: Negative for headaches, weakness or numbness. Psych: No SI or HI  ____________________________________________   PHYSICAL EXAM:  VITAL SIGNS: ED Triage Vitals  Enc Vitals Group     BP 07/02/18 1430 (!) 150/118     Pulse Rate 07/02/18 1430 76     Resp 07/02/18 1430 (!) 24     Temp 07/02/18 1430 98.8 F (37.1 C)     Temp Source 07/02/18 1430 Oral     SpO2 07/02/18 1430 93 %     Weight 07/02/18 1431 246 lb (111.6 kg)     Height --      Head Circumference --      Peak Flow --      Pain Score 07/02/18 1431 7     Pain Loc --      Pain Edu? --      Excl. in [place]? --     Constitutional: Alert and oriented. Well appearing and in no apparent distress. HEENT:      Head: Normocephalic and atraumatic.         Eyes: Conjunctivae are normal. Sclera is non-icteric.       Mouth/Throat: Mucous membranes are moist.       Neck: Supple with no signs of meningismus. Cardiovascular: Regular rate and rhythm. No murmurs, gallops, or rubs. 2+ symmetrical distal pulses are present in all extremities. No JVD. Respiratory: Increased  work of breathing, satting the low 90s on room air diffuse wheezing bilaterally  Gastrointestinal: Soft, non tender, and non distended with positive bowel sounds. No rebound or guarding. Musculoskeletal: Nontender with normal range of motion in all extremities. No edema, cyanosis, or erythema of extremities. Neurologic: Normal speech and language. Face is symmetric. Moving all extremities. No gross focal neurologic deficits are appreciated. Skin: Skin is warm, dry and intact. No rash noted. Psychiatric: Mood and affect are normal. Speech and behavior are normal.  ____________________________________________   LABS (all labs ordered are listed, but only abnormal results are displayed)  Labs Reviewed  BASIC METABOLIC PANEL - Abnormal; Notable for the following components:      Result Value   Glucose, Bld 168 (*)    Creatinine,  Ser 1.11 (*)    GFR calc non Af Amer 56 (*)    All other components within normal limits  CBC  TROPONIN I   ____________________________________________  EKG  ED ECG REPORT I, Rudene Re, the attending physician, personally viewed and interpreted this ECG.  Normal sinus rhythm, rate of 66, normal intervals, normal axis, low voltage QRS, no ST elevations or depressions.  EKG is unchanged from prior. ____________________________________________  RADIOLOGY  I have personally reviewed the images performed during this visit and I agree with the Radiologist's read.   Interpretation by Radiologist:  Dg Chest 2 View  Result Date: 07/02/2018 CLINICAL DATA:  Chest pain and shortness of breath. EXAM: CHEST - 2 VIEW COMPARISON:  Chest x-ray 12/04/2017 FINDINGS: The cardiac silhouette, mediastinal and hilar contours are within normal limits and stable. The lungs are clear of an acute process. No infiltrates, edema or effusions. The bony structures are unremarkable and stable. IMPRESSION: No acute cardiopulmonary findings. Electronically Signed   By: Marijo Sanes M.D.   On: 07/02/2018 14:55     ____________________________________________   PROCEDURES  Procedure(s) performed: None Procedures Critical Care performed:  None ____________________________________________   INITIAL IMPRESSION / ASSESSMENT AND PLAN / ED COURSE  52 y.o. female with a history of asthma and COPD who presents for evaluation of shortness of breath, productive cough, subjective fever, chills, and wheezing.  Patient is in mild to moderate respiratory distress, tachypneic, satting the low 90s on room air with diffuse expiratory wheezes bilaterally, she is afebrile.  Labs with no leukocytosis.  EKG and troponin with no evidence of ischemia.  Chest x-ray with no evidence of pneumonia or pneumothorax.  Presentation concerning for bronchitis/COPD exacerbation vs CA-PNA.  Will start patient on steroids, duo nebs, and doxycycline. Low suspicion for PE.  Clinical Course as of Jul 02 1642  Sun Jul 02, 2018  1640 Patient satting 100% with improvement on respiratory rate, moving good air with mild faint wheezing.  At this time stable for discharge home.  Will give prednisone, doxycycline, and albuterol for home.  Discussed return precautions for hemoptysis, pleuritic chest pain, fever, worsening shortness of breath.  Otherwise she will follow-up with her primary care doctor in a couple of days.   [CV]    Clinical Course User Index [CV] Alfred Levins [place], MD     As part of my medical decision making, I reviewed the following data within the [facility] notes reviewed and incorporated, Labs reviewed , EKG interpreted , Old EKG reviewed, Old chart reviewed, Radiograph reviewed , Notes from prior ED visits and [place] Controlled Substance Database    Pertinent labs & imaging results that were available during my care of the patient were reviewed by me and considered in my medical decision making (see chart for  details).    ____________________________________________   FINAL CLINICAL IMPRESSION(S) / ED DIAGNOSES  Final diagnoses:  Bronchitis      NEW MEDICATIONS STARTED DURING THIS VISIT:  ED Discharge Orders         Ordered    predniSONE (DELTASONE) 20 MG tablet  Daily     07/02/18 1642    doxycycline (VIBRAMYCIN) 100 MG capsule  2 times daily     07/02/18 1642           Note:  This document was prepared using Dragon voice recognition software and may include unintentional dictation errors.    Alfred Levins, [place], MD 07/02/18 [phone]

## 2018-07-02 NOTE — ED Triage Notes (Signed)
Pt to ED via POV c/o chest pain and shortness of breath. Pt states that she has hx/o COPD. Pt has also been coughing since Friday. Pt has increased work of breathing and audible wheezing.

## 2018-08-04 ENCOUNTER — Emergency Department
Admission: EM | Admit: 2018-08-04 | Discharge: 2018-08-04 | Discharge status: Against Medical Advice | Payer: Medicaid Other

## 2018-11-08 ENCOUNTER — Emergency Department: Payer: Medicaid Other

## 2018-11-08 ENCOUNTER — Encounter: Payer: Self-pay | Admitting: Emergency Medicine

## 2018-11-08 ENCOUNTER — Emergency Department
Admission: EM | Admit: 2018-11-08 | Discharge: 2018-11-08 | Disposition: A | Discharge status: Home / Self Care | Payer: Medicaid Other | Attending: Emergency Medicine | Admitting: Emergency Medicine

## 2018-11-08 DIAGNOSIS — R0789 Other chest pain: Secondary | ICD-10-CM | POA: Insufficient documentation

## 2018-11-08 DIAGNOSIS — Z79899 Other long term (current) drug therapy: Secondary | ICD-10-CM | POA: Diagnosis not present

## 2018-11-08 DIAGNOSIS — R0602 Shortness of breath: Secondary | ICD-10-CM | POA: Insufficient documentation

## 2018-11-08 DIAGNOSIS — J449 Chronic obstructive pulmonary disease, unspecified: Secondary | ICD-10-CM | POA: Diagnosis not present

## 2018-11-08 DIAGNOSIS — J45909 Unspecified asthma, uncomplicated: Secondary | ICD-10-CM | POA: Insufficient documentation

## 2018-11-08 DIAGNOSIS — Z87891 Personal history of nicotine dependence: Secondary | ICD-10-CM | POA: Insufficient documentation

## 2018-11-08 DIAGNOSIS — R05 Cough: Secondary | ICD-10-CM | POA: Diagnosis not present

## 2018-11-08 DIAGNOSIS — R059 Cough, unspecified: Secondary | ICD-10-CM

## 2018-11-08 DIAGNOSIS — Z85528 Personal history of other malignant neoplasm of kidney: Secondary | ICD-10-CM | POA: Insufficient documentation

## 2018-11-08 HISTORY — DX: Essential (primary) hypertension: I10

## 2018-11-08 LAB — TROPONIN I
Troponin I: <0.03 ng/mL (ref <0.03)
Troponin I: <0.03 ng/mL (ref <0.03)

## 2018-11-08 LAB — CBC
HCT: 33.1 % — ABNORMAL LOW (ref 36.0–46.0)
Hemoglobin: 10.6 g/dL — ABNORMAL LOW (ref 12.0–15.0)
MCH: 30 pg (ref 26.0–34.0)
MCHC: 32 g/dL (ref 30.0–36.0)
MCV: 93.8 fL (ref 80.0–100.0)
Platelets: 243 10*3/uL (ref 150–400)
RBC: 3.53 MIL/uL — ABNORMAL LOW (ref 3.87–5.11)
RDW: 13.7 % (ref 11.5–15.5)
WBC: 5 10*3/uL (ref 4.0–10.5)
nRBC: 0 % (ref 0.0–0.2)

## 2018-11-08 LAB — BASIC METABOLIC PANEL
Anion gap: 5 (ref 5–15)
BUN: 21 mg/dL — ABNORMAL HIGH (ref 6–20)
CO2: 25 mmol/L (ref 22–32)
Calcium: 8.9 mg/dL (ref 8.9–10.3)
Chloride: 109 mmol/L (ref 98–111)
Creatinine, Ser: 0.92 mg/dL (ref 0.44–1.00)
GFR calc Af Amer: >60 mL/min (ref >60)
GFR calc non Af Amer: >60 mL/min (ref >60)
Glucose, Bld: 105 mg/dL — ABNORMAL HIGH (ref 70–99)
POTASSIUM: 4.4 mmol/L (ref 3.5–5.1)
Sodium: 139 mmol/L (ref 135–145)

## 2018-11-08 LAB — FIBRIN DERIVATIVES D-DIMER (ARMC ONLY): Fibrin derivatives D-dimer (ARMC): 564.42 ng/mL (FEU) — ABNORMAL HIGH (ref 0.00–499.00)

## 2018-11-08 MED ORDER — DIPHENHYDRAMINE HCL 25 MG PO CAPS
50.0000 mg | ORAL_CAPSULE | Freq: Once | ORAL | Status: AC
  Administered 2018-11-08: 50 mg via ORAL
  Filled 2018-11-08: qty 2

## 2018-11-08 MED ORDER — KETOROLAC TROMETHAMINE 30 MG/ML IJ SOLN
30.0000 mg | Freq: Once | INTRAMUSCULAR | Status: AC
  Administered 2018-11-08: 30 mg via INTRAVENOUS
  Filled 2018-11-08: qty 1

## 2018-11-08 MED ORDER — IOHEXOL 350 MG/ML SOLN
75.0000 mL | Freq: Once | INTRAVENOUS | Status: AC | PRN
  Administered 2018-11-08: 75 mL via INTRAVENOUS

## 2018-11-08 MED ORDER — NAPROXEN 250 MG PO TABS: 250.0000 mg | ORAL_TABLET | Freq: Two times a day (BID) | ORAL | 0 refills | Status: DC

## 2018-11-08 MED ORDER — HALOPERIDOL LACTATE 5 MG/ML IJ SOLN
5.0000 mg | Freq: Once | INTRAMUSCULAR | Status: AC
  Administered 2018-11-08: 5 mg via INTRAVENOUS
  Filled 2018-11-08: qty 1

## 2018-11-08 MED ORDER — METHYLPREDNISOLONE SODIUM SUCC 40 MG IJ SOLR
40.0000 mg | Freq: Once | INTRAMUSCULAR | Status: AC
  Administered 2018-11-08: 40 mg via INTRAVENOUS
  Filled 2018-11-08: qty 1

## 2018-11-08 NOTE — ED Provider Notes (Signed)
[facility] Emergency Department Provider Note   ____________________________________________   I have reviewed the triage vital signs and the nursing notes.   HISTORY  Chief Complaint Chest Pain and Shortness of Breath   History limited by: Not Limited   HPI Wendy Ramos is a 53 y.o. female who presents to the emergency department today because of concern for chest pain. It is located in the left chest. She states it is sharp. It started last night when she was lying down and has continued to get worse. The patient states that the pain is worse with movement and deep breaths.  She has been having some shortness of breath with it.  She is unsure if this is just due to the pain.  Patient denies any recent travel.  Denies any pain or recent swelling in her legs.  She denies history of blood clots.  Denies similar symptoms in the past.  Per medical record review patient has a history of COPD  Past Medical History:  Diagnosis Date  . Asthma   . Cancer [facility])    kidney cancer- 2014;   . COPD (chronic obstructive pulmonary disease) [facility])     Patient Active Problem List   Diagnosis Date Noted  . Influenza A 12/04/2017  . Acute respiratory failure ([place]) 12/04/2017  . Chest pain 12/04/2017  . Asthma exacerbation 09/10/2017    Past Surgical History:  Procedure Laterality Date  . ABDOMINAL HYSTERECTOMY    . CHOLECYSTECTOMY    . KIDNEY SURGERY      Prior to Admission medications   Medication Sig Start Date End Date Taking? Authorizing Provider  amLODipine (NORVASC) 10 MG tablet Take 10 mg by mouth daily. 02/24/17   [provider]  benzonatate (TESSALON) 200 MG capsule Take 1 capsule (200 mg total) by mouth 3 (three) times daily. 09/12/17   Bettey Costa, MD  cetirizine (ZYRTEC) 10 MG tablet Take 10 mg by mouth daily.  07/15/17   [provider]  famotidine (PEPCID) 20 MG tablet Take 20 mg by mouth 2 (two) times daily.     [provider]  ibuprofen (ADVIL,MOTRIN) 400 MG tablet Take 1 tablet (400 mg total) by mouth every 6 (six) hours as needed for headache. 12/06/17   Max Sane, MD  ipratropium-albuterol (DUONEB) 0.5-2.5 (3) MG/3ML SOLN Take 3 mLs by nebulization every 6 (six) hours as needed (sob wheezing). 09/12/17   Bettey Costa, MD  methocarbamol (ROBAXIN) 500 MG tablet Take 2 tablets (1,000 mg total) by mouth 4 (four) times daily as needed for muscle spasms (muscle spasm/pain). Patient not taking: Reported on 09/10/2017 04/29/17   Francine Graven, DO  mometasone-formoterol [facility]) 200-5 MCG/ACT AERO Inhale 2 puffs into the lungs 2 (two) times daily. 09/12/17 12/04/18  Bettey Costa, MD  nicotine polacrilex (NICORETTE) 2 MG gum Take 2 mg by mouth as needed for smoking cessation.    [provider]  omeprazole (PRILOSEC) 40 MG capsule Take 40 mg by mouth 2 (two) times daily.    [provider]  oseltamivir (TAMIFLU) 75 MG capsule Take 1 capsule (75 mg total) by mouth 2 (two) times daily. 12/06/17   Max Sane, MD  oxyCODONE-acetaminophen (PERCOCET/ROXICET) 5-325 MG tablet Take 1 tablet by mouth 2 (two) times daily as needed for severe pain (chronic back pain).    [provider]  potassium chloride (K-DUR) 10 MEQ tablet 10 mEq 2 (two) times daily. 03/08/17   [provider]  PROAIR HFA 108 [id number] Base) MCG/ACT  inhaler Inhale 2 puffs into the lungs every 6 (six) hours as needed for wheezing. 06/19/17   [provider]    Allergies Chicken allergy; Chocolate flavor; Fish-derived products; Metronidazole; Cefazolin; Iopamidol; Beef-derived products; Fish allergy; and Galactose  Family History  Problem Relation Age of Onset  . Hypertension Mother   . Heart disease Mother     Social History Social History   Tobacco Use  . Smoking status: Former Smoker    Packs/day: 0.50    Types: Cigarettes    Last attempt to quit: 10/11/2018    Years since quitting: 0.0  . Smokeless tobacco:  Never Used  . Tobacco comment: states she smokes marijuana, Will give cessation materials upon return  Substance Use Topics  . Alcohol use: No  . Drug use: No    Review of Systems Constitutional: No fever/chills Eyes: No visual changes. ENT: No sore throat. Cardiovascular: Positive for chest pain. Respiratory: Positive for shortness of breath. Gastrointestinal: No abdominal pain.  No nausea, no vomiting.  No diarrhea.   Genitourinary: Negative for dysuria. Musculoskeletal: Negative for back pain. Skin: Negative for rash. Neurological: Negative for headaches, focal weakness or numbness.  ____________________________________________   PHYSICAL EXAM:  VITAL SIGNS: ED Triage Vitals  Enc Vitals Group     BP 11/08/18 1004 139/86     Pulse Rate 11/08/18 1004 (!) 56     Resp 11/08/18 1004 20     Temp 11/08/18 1004 98.3 F (36.8 C)     Temp Source 11/08/18 1004 Oral     SpO2 11/08/18 1004 100 %     Weight 11/08/18 1005 255 lb (115.7 kg)     Height 11/08/18 1005 5\' 4"  (1.626 m)     Head Circumference --      Peak Flow --      Pain Score 11/08/18 1005 9   Constitutional: Alert and oriented.  Eyes: Conjunctivae are normal.  ENT      Head: Normocephalic and atraumatic.      Nose: No congestion/rhinnorhea.      Mouth/Throat: Mucous membranes are moist.      Neck: No stridor. Hematological/Lymphatic/Immunilogical: No cervical lymphadenopathy. Cardiovascular: Normal rate, regular rhythm.  No murmurs, rubs, or gallops. Respiratory: Normal respiratory effort without tachypnea nor retractions. Breath sounds are clear and equal bilaterally. No wheezes/rales/rhonchi. Gastrointestinal: Soft and non tender. No rebound. No guarding.  Genitourinary: Deferred Musculoskeletal: Normal range of motion in all extremities. No lower extremity edema. Neurologic:  Normal speech and language. No gross focal neurologic deficits are appreciated.  Skin:  Skin is warm, dry and intact. No rash  noted. Psychiatric: Mood and affect are normal. Speech and behavior are normal. Patient exhibits appropriate insight and judgment.  ____________________________________________    LABS (pertinent positives/negatives)  CBC wbc 5.0, hgb 10.6, plt 243 D-dimer 564 BMP wnl except glu 105, bun 21 CBC wbc 5.0, hgb 10.6, plt 243 ____________________________________________   EKG  I, Nance Pear, attending physician, personally viewed and interpreted this EKG  EKG Time: 0956 Rate: 59 Rhythm: sinus bradycardia Axis: normal Intervals: qtc 405 QRS: narrow ST changes: no st elevation Impression: sinus bradycardia otherwise normal   ____________________________________________    RADIOLOGY  CXR No edema or consolidation  ____________________________________________   PROCEDURES  Procedures  ____________________________________________   INITIAL IMPRESSION / ASSESSMENT AND PLAN / ED COURSE  Pertinent labs & imaging results that were available during my care of the patient were reviewed by me and considered in my medical decision making (see chart  for details).   Patient presents to the emergency department today because of concerns for left sided chest pain.  It is worse with deep breaths.  Differential would include PE, ACS, dissection, pneumonia, pneumothorax, costochondritis, muscle injury amongst other etiologies.  Patient's troponin was negative.  I would expect some elevation if patient truly been suffering ACS for this number of hours.  D-dimer was elevated concerning for possible PE.  Given that the patient did have some shortness of breath and worsening pain with deep breaths will get CT scan to better evaluate.  Patient is allergic so will premedicate per protocol. Discussed findings and plan with patient.    ____________________________________________   FINAL CLINICAL IMPRESSION(S) / ED DIAGNOSES  Chest pain  Note: This dictation was prepared with Dragon  dictation. Any transcriptional errors that result from this process are unintentional     Nance Pear, MD 11/08/18 1451

## 2018-11-08 NOTE — ED Notes (Signed)
Pt c/o chest pain and shortness of breath that started last pm - she reporst that the chest pain is located on the left side and radiates into back - the pain is made worse from any movement - pt denies injury - pt has hx of COPD and reports that she has had a "bad cough" for the last few months (green phlegm) - she reports that she stopped smoking December of last year

## 2018-11-08 NOTE — ED Triage Notes (Signed)
Patient presents to the ED with shortness of breath and sharp chest pain on the left side that radiates from the front of her left breast through to her back.  Patient reports pain is worse with taking a deep breath.  Patient appears uncomfortable.  Patient denies any injury.

## 2018-11-08 NOTE — ED Notes (Signed)
Patient transported to CT 

## 2018-11-08 NOTE — ED Provider Notes (Addendum)
-----------------------------------------   3:11 PM on 11/08/2018 -----------------------------------------  Signed out to me at this time, by Dr. Archie Balboa. He has a very low suspicion for PE, however d-dimer is borderline, Dr. Archie Balboa has ordered a CT scan of her chest, if that is negative, she is to be discharged.   ----------------------------------------- 7:18 PM on 11/08/2018 -----------------------------------------  Patient resting comfortably in the bed, CT scan has not been performed, we are awaiting the results second troponin is negative she has no complaints or concerns except for she is hungry and would like to go home, I have assured her that if the CT is negative that is her plan as well   Schuyler Amor, MD 11/08/18 1511    Schuyler Amor, MD 11/08/18 1658    Schuyler Amor, MD 11/08/18 1919

## 2018-11-08 NOTE — ED Notes (Signed)
Patient verbalized understanding of discharge instructions, no questions. Patient ambulated out of ED with steady gait in no distress.  

## 2019-09-04 DIAGNOSIS — G4733 Obstructive sleep apnea (adult) (pediatric): Secondary | ICD-10-CM | POA: Insufficient documentation

## 2019-09-17 ENCOUNTER — Emergency Department: Payer: Medicaid Other

## 2019-09-17 ENCOUNTER — Other Ambulatory Visit: Payer: Self-pay

## 2019-09-17 ENCOUNTER — Encounter: Payer: Self-pay | Admitting: Emergency Medicine

## 2019-09-17 ENCOUNTER — Emergency Department
Admission: EM | Admit: 2019-09-17 | Discharge: 2019-09-17 | Disposition: A | Discharge status: Home / Self Care | Payer: Medicaid Other | Attending: Emergency Medicine | Admitting: Emergency Medicine

## 2019-09-17 DIAGNOSIS — Z87891 Personal history of nicotine dependence: Secondary | ICD-10-CM | POA: Diagnosis not present

## 2019-09-17 DIAGNOSIS — M25561 Pain in right knee: Secondary | ICD-10-CM | POA: Diagnosis present

## 2019-09-17 DIAGNOSIS — J45909 Unspecified asthma, uncomplicated: Secondary | ICD-10-CM | POA: Diagnosis not present

## 2019-09-17 DIAGNOSIS — R2241 Localized swelling, mass and lump, right lower limb: Secondary | ICD-10-CM | POA: Insufficient documentation

## 2019-09-17 DIAGNOSIS — Z79899 Other long term (current) drug therapy: Secondary | ICD-10-CM | POA: Diagnosis not present

## 2019-09-17 DIAGNOSIS — I10 Essential (primary) hypertension: Secondary | ICD-10-CM | POA: Insufficient documentation

## 2019-09-17 DIAGNOSIS — Z85528 Personal history of other malignant neoplasm of kidney: Secondary | ICD-10-CM | POA: Insufficient documentation

## 2019-09-17 DIAGNOSIS — J449 Chronic obstructive pulmonary disease, unspecified: Secondary | ICD-10-CM | POA: Insufficient documentation

## 2019-09-17 NOTE — ED Provider Notes (Signed)
[facility] Emergency Department Provider Note  ____________________________________________   First MD Initiated Contact with Patient 09/17/19 1516     (approximate)  I have reviewed the triage vital signs and the nursing notes.   HISTORY  Chief Complaint Knee Pain    HPI Wendy Ramos is a 53 y.o. female presents emergency department complaint of right knee pain.  States she was seen for the same last week over [facility].  Pain is getting worse.  States she has more swelling at the knee.  She was placed on prednisone and hydrocodone.  No known injury.  History of arthritis.    Past Medical History:  Diagnosis Date   Asthma    Cancer ([place])    kidney cancer- 2014;    COPD (chronic obstructive pulmonary disease) ([place])    Hypertension     Patient Active Problem List   Diagnosis Date Noted   Influenza A 12/04/2017   Acute respiratory failure ([place]) 12/04/2017   Chest pain 12/04/2017   Asthma exacerbation 09/10/2017    Past Surgical History:  Procedure Laterality Date   ABDOMINAL HYSTERECTOMY     CHOLECYSTECTOMY     KIDNEY SURGERY      Prior to Admission medications   Medication Sig Start Date End Date Taking? Authorizing Provider  amLODipine (NORVASC) 10 MG tablet Take 10 mg by mouth daily. 02/24/17   [provider]  budesonide-formoterol (SYMBICORT) 80-4.5 MCG/ACT inhaler Inhale 2 puffs into the lungs 2 (two) times daily. 04/02/18 04/02/19  [provider]  cetirizine (ZYRTEC) 10 MG tablet Take 10 mg by mouth daily.  07/15/17   [provider]  famotidine (PEPCID) 20 MG tablet Take 20 mg by mouth 2 (two) times daily.     [provider]  ipratropium-albuterol (DUONEB) 0.5-2.5 (3) MG/3ML SOLN Take 3 mLs by nebulization every 6 (six) hours as needed (sob wheezing). 09/12/17   Bettey Costa, MD  nicotine polacrilex (NICORETTE) 2 MG gum Take 2 mg by mouth as needed for smoking cessation.    [provider]  omeprazole (PRILOSEC) 40 MG capsule Take 40 mg by mouth 2 (two) times daily.    [provider]  oxyCODONE-acetaminophen (PERCOCET/ROXICET) 5-325 MG tablet Take 1 tablet by mouth 2 (two) times daily as needed for severe pain (chronic back pain).    [provider]  potassium chloride (K-DUR) 10 MEQ tablet Take 10 mEq by mouth 2 (two) times daily.  03/08/17   [provider]  PROAIR HFA 108 (90 Base) MCG/ACT inhaler Inhale 2 puffs into the lungs every 6 (six) hours as needed for wheezing. 06/19/17   [provider]    Allergies Chicken allergy, Chocolate flavor, Fish-derived products, Metronidazole, Cefazolin, Iopamidol, Beef-derived products, Fish allergy, and Galactose  Family History  Problem Relation Age of Onset   Hypertension Mother    Heart disease Mother     Social History Social History   Tobacco Use   Smoking status: Former Smoker    Packs/day: 0.50    Types: Cigarettes    Quit date: 10/11/2018    Years since quitting: 0.9   Smokeless tobacco: Never Used   Tobacco comment: states she smokes marijuana, Will give cessation materials upon return  Substance Use Topics   Alcohol use: No   Drug use: No    Review of Systems  Constitutional: No fever/chills Eyes: No visual changes. ENT: No sore throat. Respiratory: Denies cough Genitourinary: Negative for dysuria. Musculoskeletal: Negative for back pain.  Positive for right knee pain Skin: Negative for rash.    ____________________________________________   PHYSICAL EXAM:  VITAL SIGNS: ED Triage Vitals  Enc Vitals Group     BP 09/17/19 1504 (!) 143/83     Pulse Rate 09/17/19 1504 61     Resp 09/17/19 1504 18     Temp 09/17/19 1504 98.9 F (37.2 C)     Temp Source 09/17/19 1504 Oral     SpO2 09/17/19 1504 100 %     Weight 09/17/19 1505 269 lb (122 kg)     Height 09/17/19 1505 5\' 1"  (1.549 m)     Head Circumference --      Peak Flow --      Pain  Score 09/17/19 1510 7     Pain Loc --      Pain Edu? --      Excl. in [place]? --     Constitutional: Alert and oriented. Well appearing and in no acute distress. Eyes: Conjunctivae are normal.  Head: Atraumatic. Nose: No congestion/rhinnorhea. Mouth/Throat: Mucous membranes are moist.   Neck:  supple no lymphadenopathy noted Cardiovascular: Normal rate, regular rhythm. Respiratory: Normal respiratory effort.  No retractions,  GU: deferred Musculoskeletal: FROM all extremities, warm and well perfused, positive for swelling of suprapatellar bursa, tender at the joint line, calf is nontender, neurovascular is intact Neurologic:  Normal speech and language.  Skin:  Skin is warm, dry and intact. No rash noted. Psychiatric: Mood and affect are normal. Speech and behavior are normal.  ____________________________________________   LABS (all labs ordered are listed, but only abnormal results are displayed)  Labs Reviewed - No data to display ____________________________________________   ____________________________________________  RADIOLOGY  X-ray of the right knee shows a small effusion above the patella Ultrasound of the right lower leg is negative for DVT  ____________________________________________   PROCEDURES  Procedure(s) performed: [place] Ramp wrap applied by nursing staff   Procedures    ____________________________________________   INITIAL IMPRESSION / ASSESSMENT AND PLAN / ED COURSE  Pertinent labs & imaging results that were available during my care of the patient were reviewed by me and considered in my medical decision making (see chart for details).   Patient is 53 year old female presents emergency department complaining of right knee pain.  See HPI  Physical exam shows right knee to be tender.  Swelling noted suprapatellar bursa.  X-ray of the right knee is negative ultrasound right lower extremity is negative   Explained the test results to the  patient.  She is placed in [place] wrap by nursing staff.  She is to continue taking her steroid pack and her pain medication as needed.  Apply ice to the lower extremity.  She already has an appointment with [facility] clinic orthopedics on Friday.  Instructed her to keep this appointment for further evaluation.  She states she understands will comply.  Is discharged stable condition.  Wendy Ramos was evaluated in Emergency Department on 09/17/2019 for the symptoms described in the history of present illness. She was evaluated in the context of the global COVID-19 pandemic, which necessitated consideration that the patient might be at risk for infection with the SARS-CoV-2 virus that causes COVID-19. Institutional protocols and algorithms that pertain to the evaluation of patients at risk for COVID-19 are in a state of rapid change based on information released by regulatory bodies including the CDC and federal and state organizations. These policies and algorithms were followed during the patient's care in the ED.   As  part of my medical decision making, I reviewed the following data within the [facility] notes reviewed and incorporated, Old chart reviewed, Radiograph reviewed , Notes from prior ED visits and [place] Controlled Substance Database  ____________________________________________   FINAL CLINICAL IMPRESSION(S) / ED DIAGNOSES  Final diagnoses:  Acute pain of right knee      NEW MEDICATIONS STARTED DURING THIS VISIT:  Discharge Medication List as of 09/17/2019  5:26 PM       Note:  This document was prepared using Dragon voice recognition software and may include unintentional dictation errors.    Versie Starks, PA-C 09/17/19 1806    Blake Divine, MD 09/20/19 [phone]

## 2019-09-17 NOTE — ED Triage Notes (Signed)
Pt reports pain was seen here last week for pain to her right knee and now the knee is worse and it is throbbing. Pt reports it has made her right hand shake.

## 2019-09-17 NOTE — Discharge Instructions (Addendum)
Wear the [place] wrap for the next 2 days.  Apply ice.  If the wrap feels too tight you can loosen it.  Follow-up with [facility] clinic orthopedics.

## 2019-09-17 NOTE — ED Notes (Addendum)
See triage note  Cont's to have pain to right knee  States she was seen for same last week  States pain is getting worse  Ambulates with slight limp

## 2019-10-03 DIAGNOSIS — M7918 Myalgia, other site: Secondary | ICD-10-CM | POA: Insufficient documentation

## 2019-10-03 DIAGNOSIS — M5136 Other intervertebral disc degeneration, lumbar region: Secondary | ICD-10-CM | POA: Insufficient documentation

## 2019-12-13 ENCOUNTER — Emergency Department: Payer: Medicaid Other

## 2019-12-13 ENCOUNTER — Other Ambulatory Visit: Payer: Self-pay

## 2019-12-13 ENCOUNTER — Emergency Department
Admission: EM | Admit: 2019-12-13 | Discharge: 2019-12-13 | Disposition: A | Discharge status: Home / Self Care | Payer: Medicaid Other | Attending: Emergency Medicine | Admitting: Emergency Medicine

## 2019-12-13 ENCOUNTER — Encounter: Payer: Self-pay | Admitting: Emergency Medicine

## 2019-12-13 DIAGNOSIS — R109 Unspecified abdominal pain: Secondary | ICD-10-CM | POA: Diagnosis present

## 2019-12-13 DIAGNOSIS — Z79899 Other long term (current) drug therapy: Secondary | ICD-10-CM | POA: Diagnosis not present

## 2019-12-13 DIAGNOSIS — I10 Essential (primary) hypertension: Secondary | ICD-10-CM | POA: Insufficient documentation

## 2019-12-13 DIAGNOSIS — J45909 Unspecified asthma, uncomplicated: Secondary | ICD-10-CM | POA: Insufficient documentation

## 2019-12-13 DIAGNOSIS — Z87891 Personal history of nicotine dependence: Secondary | ICD-10-CM | POA: Diagnosis not present

## 2019-12-13 DIAGNOSIS — Z85528 Personal history of other malignant neoplasm of kidney: Secondary | ICD-10-CM | POA: Diagnosis not present

## 2019-12-13 LAB — URINALYSIS, COMPLETE (UACMP) WITH MICROSCOPIC
Bilirubin Urine: NEGATIVE
Glucose, UA: NEGATIVE mg/dL
Ketones, ur: NEGATIVE mg/dL
Leukocytes,Ua: NEGATIVE
Nitrite: NEGATIVE
Protein, ur: NEGATIVE mg/dL
Specific Gravity, Urine: 1.017 (ref 1.005–1.030)
WBC, UA: NONE SEEN WBC/hpf (ref 0–5)
pH: 5 (ref 5.0–8.0)

## 2019-12-13 LAB — COMPREHENSIVE METABOLIC PANEL
ALT: 21 U/L (ref 0–44)
AST: 22 U/L (ref 15–41)
Albumin: 3.8 g/dL (ref 3.5–5.0)
Alkaline Phosphatase: 88 U/L (ref 38–126)
Anion gap: 11 (ref 5–15)
BUN: 19 mg/dL (ref 6–20)
CO2: 22 mmol/L (ref 22–32)
Calcium: 9.2 mg/dL (ref 8.9–10.3)
Chloride: 108 mmol/L (ref 98–111)
Creatinine, Ser: 1.07 mg/dL — ABNORMAL HIGH (ref 0.44–1.00)
GFR calc Af Amer: >60 mL/min (ref >60)
GFR calc non Af Amer: 58 mL/min — ABNORMAL LOW (ref >60)
Glucose, Bld: 111 mg/dL — ABNORMAL HIGH (ref 70–99)
Potassium: 3.6 mmol/L (ref 3.5–5.1)
Sodium: 141 mmol/L (ref 135–145)
Total Bilirubin: 0.5 mg/dL (ref 0.3–1.2)
Total Protein: 7.2 g/dL (ref 6.5–8.1)

## 2019-12-13 LAB — CBC
HCT: 33.8 % — ABNORMAL LOW (ref 36.0–46.0)
Hemoglobin: 11.1 g/dL — ABNORMAL LOW (ref 12.0–15.0)
MCH: 31.2 pg (ref 26.0–34.0)
MCHC: 32.8 g/dL (ref 30.0–36.0)
MCV: 94.9 fL (ref 80.0–100.0)
Platelets: 237 10*3/uL (ref 150–400)
RBC: 3.56 MIL/uL — ABNORMAL LOW (ref 3.87–5.11)
RDW: 14.3 % (ref 11.5–15.5)
WBC: 4.6 10*3/uL (ref 4.0–10.5)
nRBC: 0 % (ref 0.0–0.2)

## 2019-12-13 LAB — LIPASE, BLOOD: Lipase: 26 U/L (ref 11–51)

## 2019-12-13 MED ORDER — ONDANSETRON HCL 4 MG/2ML IJ SOLN
4.0000 mg | Freq: Once | INTRAMUSCULAR | Status: AC
  Administered 2019-12-13: 10:00:00 4 mg via INTRAVENOUS
  Filled 2019-12-13: qty 2

## 2019-12-13 MED ORDER — SODIUM CHLORIDE 0.9 % IV BOLUS
1000.0000 mL | Freq: Once | INTRAVENOUS | Status: AC
  Administered 2019-12-13: 11:00:00 1000 mL via INTRAVENOUS

## 2019-12-13 MED ORDER — FENTANYL CITRATE (PF) 100 MCG/2ML IJ SOLN
100.0000 ug | Freq: Once | INTRAMUSCULAR | Status: AC
  Administered 2019-12-13: 11:00:00 100 ug via INTRAVENOUS
  Filled 2019-12-13: qty 2

## 2019-12-13 MED ORDER — HYDROCODONE-ACETAMINOPHEN 5-325 MG PO TABS: 1.0000 | ORAL_TABLET | ORAL | 0 refills | Status: DC | PRN

## 2019-12-13 MED ORDER — FOSFOMYCIN TROMETHAMINE 3 G PO PACK
3.0000 g | PACK | Freq: Once | ORAL | Status: AC
  Administered 2019-12-13: 12:00:00 3 g via ORAL
  Filled 2019-12-13: qty 3

## 2019-12-13 NOTE — ED Triage Notes (Signed)
Says right flank pain with numbness in right groin, urinary frequency, no fever.  Shortness of breath.

## 2019-12-13 NOTE — ED Provider Notes (Signed)
[facility] Emergency Department Provider Note  Time seen: 9:55 AM  I have reviewed the triage vital signs and the nursing notes.   HISTORY  Chief Complaint Flank Pain and Shortness of Breath   HPI Wendy Ramos is a 54 y.o. female with a past medical history of asthma, renal carcinoma, COPD, hypertension, presents to the emergency department for acute onset of right flank pain.  According to the patient 2 days ago she developed acute onset right flank pain that was initially mild and intermittent but over the past 6 to 12 hours has become fairly constant and severe.  States nausea at times.  States shortness of breath due to the pain.  Denies any fever cough.  Patient has a tremor mostly affecting her right upper extremity, currently following up with neurology regarding this.    Past Medical History:  Diagnosis Date  . Asthma   . Cancer [facility])    kidney cancer- 2014;   . COPD (chronic obstructive pulmonary disease) ([place])   . Hypertension     Patient Active Problem List   Diagnosis Date Noted  . Influenza A 12/04/2017  . Acute respiratory failure ([place]) 12/04/2017  . Chest pain 12/04/2017  . Asthma exacerbation 09/10/2017    Past Surgical History:  Procedure Laterality Date  . ABDOMINAL HYSTERECTOMY    . CHOLECYSTECTOMY    . KIDNEY SURGERY      Prior to Admission medications   Medication Sig Start Date End Date Taking? Authorizing Provider  amLODipine (NORVASC) 10 MG tablet Take 10 mg by mouth daily. 02/24/17   [provider]  budesonide-formoterol (SYMBICORT) 80-4.5 MCG/ACT inhaler Inhale 2 puffs into the lungs 2 (two) times daily. 04/02/18 04/02/19  [provider]  cetirizine (ZYRTEC) 10 MG tablet Take 10 mg by mouth daily.  07/15/17   [provider]  famotidine (PEPCID) 20 MG tablet Take 20 mg by mouth 2 (two) times daily.     [provider]  ipratropium-albuterol (DUONEB) 0.5-2.5 (3) MG/3ML SOLN Take 3 mLs by  nebulization every 6 (six) hours as needed (sob wheezing). 09/12/17   Bettey Costa, MD  nicotine polacrilex (NICORETTE) 2 MG gum Take 2 mg by mouth as needed for smoking cessation.    [provider]  omeprazole (PRILOSEC) 40 MG capsule Take 40 mg by mouth 2 (two) times daily.    [provider]  oxyCODONE-acetaminophen (PERCOCET/ROXICET) 5-325 MG tablet Take 1 tablet by mouth 2 (two) times daily as needed for severe pain (chronic back pain).    [provider]  potassium chloride (K-DUR) 10 MEQ tablet Take 10 mEq by mouth 2 (two) times daily.  03/08/17   [provider]  PROAIR HFA 108 (90 Base) MCG/ACT inhaler Inhale 2 puffs into the lungs every 6 (six) hours as needed for wheezing. 06/19/17   [provider]    Allergies  Allergen Reactions  . Chicken Allergy Rash  . Chocolate Flavor Hives  . Fish-Derived Products Hives    Allergic to all seafood.  . Metronidazole Hives  . Cefazolin Hives    Developed local urticaria after 1.2gm ancef administration intraoperatively. Medication discontinued, no additional intervention required.  . Iopamidol Hives and Itching    Delayed reaction approx. 20 minutes post contrast.  . Beef-Derived Products Hives  . Fish Allergy Hives  . Galactose Rash    POSSIBLE ALPHA GAL ALLERGY - rash reaction to all beef and chicken    Family History  Problem Relation Age of Onset  .  Hypertension Mother   . Heart disease Mother     Social History Social History   Tobacco Use  . Smoking status: Former Smoker    Packs/day: 0.50    Types: Cigarettes    Quit date: 10/11/2018    Years since quitting: 1.1  . Smokeless tobacco: Never Used  . Tobacco comment: states she smokes marijuana, Will give cessation materials upon return  Substance Use Topics  . Alcohol use: No  . Drug use: No    Review of Systems Constitutional: Negative for fever. Cardiovascular: Negative for chest pain. Respiratory: Negative for  shortness of breath. Gastrointestinal: Right flank pain. GU: States mild dysuria/incontinence. Musculoskeletal: Negative for musculoskeletal complaints Neurological: Negative for headache All other ROS negative  ____________________________________________   PHYSICAL EXAM:  VITAL SIGNS: ED Triage Vitals  Enc Vitals Group     BP 12/13/19 0943 (!) 158/99     Pulse Rate 12/13/19 0943 69     Resp 12/13/19 0943 20     Temp 12/13/19 0943 98.7 F (37.1 C)     Temp Source 12/13/19 0943 Oral     SpO2 12/13/19 0943 98 %     Weight 12/13/19 0944 250 lb (113.4 kg)     Height 12/13/19 0944 5\' 4"  (1.626 m)     Head Circumference --      Peak Flow --      Pain Score 12/13/19 0943 8     Pain Loc --      Pain Edu? --      Excl. in [place]? --    Constitutional: Alert and oriented.  Mild distress due to discomfort holding her right flank. Eyes: Normal exam ENT      Head: Normocephalic and atraumatic.      Mouth/Throat: Mucous membranes are moist. Cardiovascular: Normal rate, regular rhythm.  Respiratory: Normal respiratory effort without tachypnea nor retractions. Breath sounds are clear Gastrointestinal: Soft and nontender. No distention.   Musculoskeletal: Nontender with normal range of motion in all extremities.  Neurologic:  Normal speech and language. No gross focal neurologic deficits  Skin:  Skin is warm, dry and intact.  Psychiatric: Mood and affect are normal.   ____________________________________________    EKG  EKG viewed and interpreted by myself shows a normal sinus rhythm at 68 bpm with a narrow QRS, normal axis, normal intervals, no concerning ST changes.  ____________________________________________    RADIOLOGY  CT negative for acute abnormality.  ____________________________________________   INITIAL IMPRESSION / ASSESSMENT AND PLAN / ED COURSE  Pertinent labs & imaging results that were available during my care of the patient were reviewed by me and  considered in my medical decision making (see chart for details).   Patient presents to the emergency department for right flank pain intermittent over the past 2 days but worse over the past 6 hours now severe per patient.  She does have a history of a prior kidney stones which this feels somewhat similar per patient.  Also describes some mild dysuria and incontinence at times.  Differential would include ureterolithiasis, UTI pyelonephritis.  We will check labs, urine, urine culture and obtain a CT renal scan.  We will dose pain and nausea medication, IV hydrate while awaiting results.  Patient agreeable to plan of care.  CT scan is negative for acute abnormality.  Patient does have a mild amount of hematuria which she states is chronic since her nephrectomy.  She does have rare bacteria as well we will cover with an antibiotic given  her complaint of mild dysuria.  We will also discharged with a short course of pain medication.  Patient agreeable to plan of care.  Sidnei Wormald was evaluated in Emergency Department on 12/13/2019 for the symptoms described in the history of present illness. She was evaluated in the context of the global COVID-19 pandemic, which necessitated consideration that the patient might be at risk for infection with the SARS-CoV-2 virus that causes COVID-19. Institutional protocols and algorithms that pertain to the evaluation of patients at risk for COVID-19 are in a state of rapid change based on information released by regulatory bodies including the CDC and federal and state organizations. These policies and algorithms were followed during the patient's care in the ED.  ____________________________________________   FINAL CLINICAL IMPRESSION(S) / ED DIAGNOSES  Right flank pain   Harvest Dark, MD 12/13/19 1142

## 2019-12-14 LAB — URINE CULTURE

## 2020-01-25 DIAGNOSIS — M5412 Radiculopathy, cervical region: Secondary | ICD-10-CM | POA: Insufficient documentation

## 2020-01-25 DIAGNOSIS — M48061 Spinal stenosis, lumbar region without neurogenic claudication: Secondary | ICD-10-CM | POA: Insufficient documentation

## 2020-02-16 ENCOUNTER — Other Ambulatory Visit: Payer: Self-pay

## 2020-02-16 ENCOUNTER — Emergency Department
Admission: EM | Admit: 2020-02-16 | Discharge: 2020-02-16 | Discharge status: Against Medical Advice | Payer: Medicaid Other

## 2020-02-19 ENCOUNTER — Encounter: Payer: Self-pay | Admitting: Dietician

## 2020-02-19 ENCOUNTER — Encounter: Payer: Medicaid Other | Attending: Physical Medicine & Rehabilitation | Admitting: Dietician

## 2020-02-19 ENCOUNTER — Other Ambulatory Visit: Payer: Self-pay

## 2020-02-19 ENCOUNTER — Telehealth: Payer: Self-pay | Admitting: Student in an Organized Health Care Education/Training Program

## 2020-02-19 VITALS — Ht 61.0 in | Wt 272.5 lb

## 2020-02-19 DIAGNOSIS — Z6841 Body Mass Index (BMI) 40.0 and over, adult: Secondary | ICD-10-CM

## 2020-02-19 DIAGNOSIS — Z6835 Body mass index (BMI) 35.0-35.9, adult: Secondary | ICD-10-CM | POA: Diagnosis not present

## 2020-02-19 DIAGNOSIS — E6609 Other obesity due to excess calories: Secondary | ICD-10-CM | POA: Insufficient documentation

## 2020-02-19 NOTE — Patient Instructions (Signed)
   Try to incorporate more beans (black beans, pinto beans, chickpeas) into your diet as healthy plant-based proteins  Switch to drinking less sweetened tea (half sweet mixed with unsweet tea, unsweet tea with lemonade, or unsweet tea with splenda or stevia)   Eat three meals a day, even if they are small meals

## 2020-02-19 NOTE — Telephone Encounter (Signed)
Called pt to schedule new pt appt but there was no answer and the voicemail was not set up to take messages.

## 2020-02-19 NOTE — Progress Notes (Signed)
Medical Nutrition Therapy: Visit start time: 0900  end time: 1030  Assessment:  Diagnosis: obesity Past medical history: HTN, GERD, sleep apnea Psychosocial issues/ stress concerns: none  Preferred learning method:  . Auditory . Visual  Current weight: 272.5 lbs  Height: 5'1" Medications, supplements: reconciled in the medical record   Progress and evaluation:  Pt reports she weighed 190 lbs in 2017-02-03 with diet changes like no soda, phentermine, and physical activity.  Pt reports that weight came back on after her son passed away in 2018-02-03.  Pt reports brother of pt was diabetic and had several complications- blindness, double foot amputee, and dialysis.  Pt also states many family members of are very overweight.  Pt states she is motivated to make healthy habits based on family history and seeing her children start to gain weight.   Pt reports having back, knee, and neck pain.    Pt states she has beef, poultry, dairy, and fish allergy; but continues to eat meat.   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 Dining out frequency: unable to determine at this time  Breakfast: yogurt with raisin bran or cheerios, everything bagel with coffee and creamer, eggs and sausage or bacon Snack: none Lunch: chicken Snack: sometimes gummy worms or candy, not usual Supper: grilled chicken salad with avocado and blue cheese/fried fish coleslaw/collard greens and green beans/[employer] filet-o-fish with half the bread or spicy chicken sandwich Snack: SF icecream, yogurt, potato chips, walnuts  Beverages: sweet tea with lemonade  Nutrition Care Education:  Basic nutrition: basic food groups, appropriate nutrient balance, appropriate meal and snack schedule, general nutrition guidelines    Weight control:  importance of low sugar and low fat choices, portion control Advanced nutrition:   recipe modification, cooking techniques Hypertension:  importance of controlling BP, identifying high sodium foods Heart Healthy diet:  healthy and unhealthy fats, role of fiber, role of exercise Other lifestyle changes:  benefits of making changes, increasing motivation, readiness for change, identifying habits that need to change  Nutritional Diagnosis:  Annabella-3.3 Overweight/obesity As related to history of excessive calorie intake and inadequate physical activity.  As evidenced by pt current BMI of 51.49.  Intervention:   Pt has already begun to make some diet changes- eating more vegetables and cutting back on excessive bread, pasta, chips, and sweets  Pt has utilized many of the healthy eating habit strategies discussed when she lost weight several years ago  Discussed importance of recognizing stress and triggers as well as developing strategies for healthy stress management   Instruction and discussion as noted above   Established goals for additional change  Education Materials given:  . General diet guidelines for Cholesterol-lowering/ Heart health . General diet guidelines for Hypertension . Mediterranean diet handout . Weight Loss Tips . Plate Planner with Food Lists  . Goals/ instructions  Learner/ who was taught:  . Patient   Level of understanding: Marland Kitchen Verbalizes/ demonstrates competency  Demonstrated degree of understanding via:   Teach back Learning barriers: . None  Willingness to learn/ readiness for change: . Eager, change in progress  Monitoring and Evaluation:  Dietary intake, exercise, and body weight      follow up: Tuesday, June 1 at [facility]

## 2020-02-26 IMAGING — CR DG CHEST 2V
2 series · 2 of 2 positions shown · non-contrast
Comparison: July 02, 2018

CLINICAL DATA: Chest pain

EXAM:
CHEST - 2 VIEW

[chest lat]
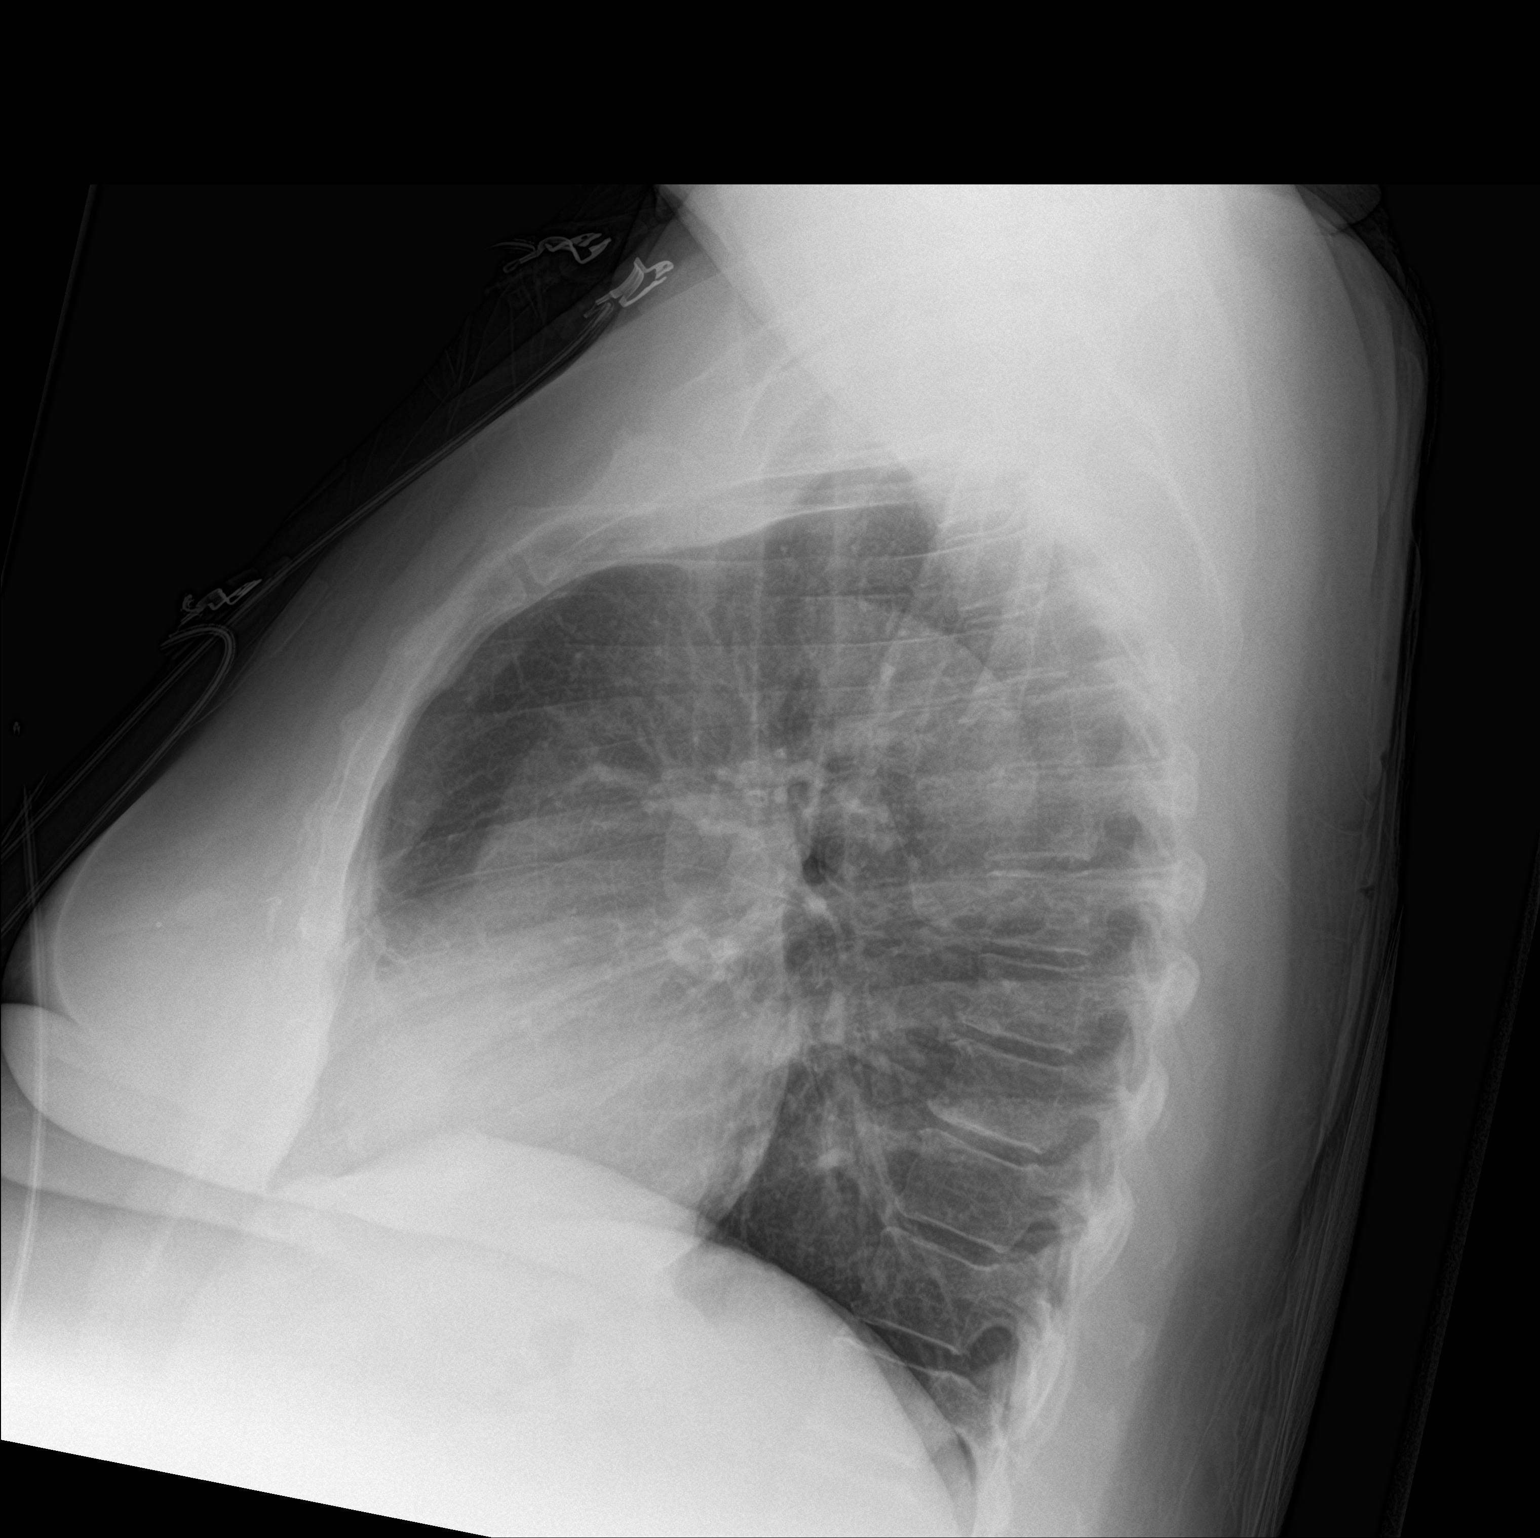

[chest ap]
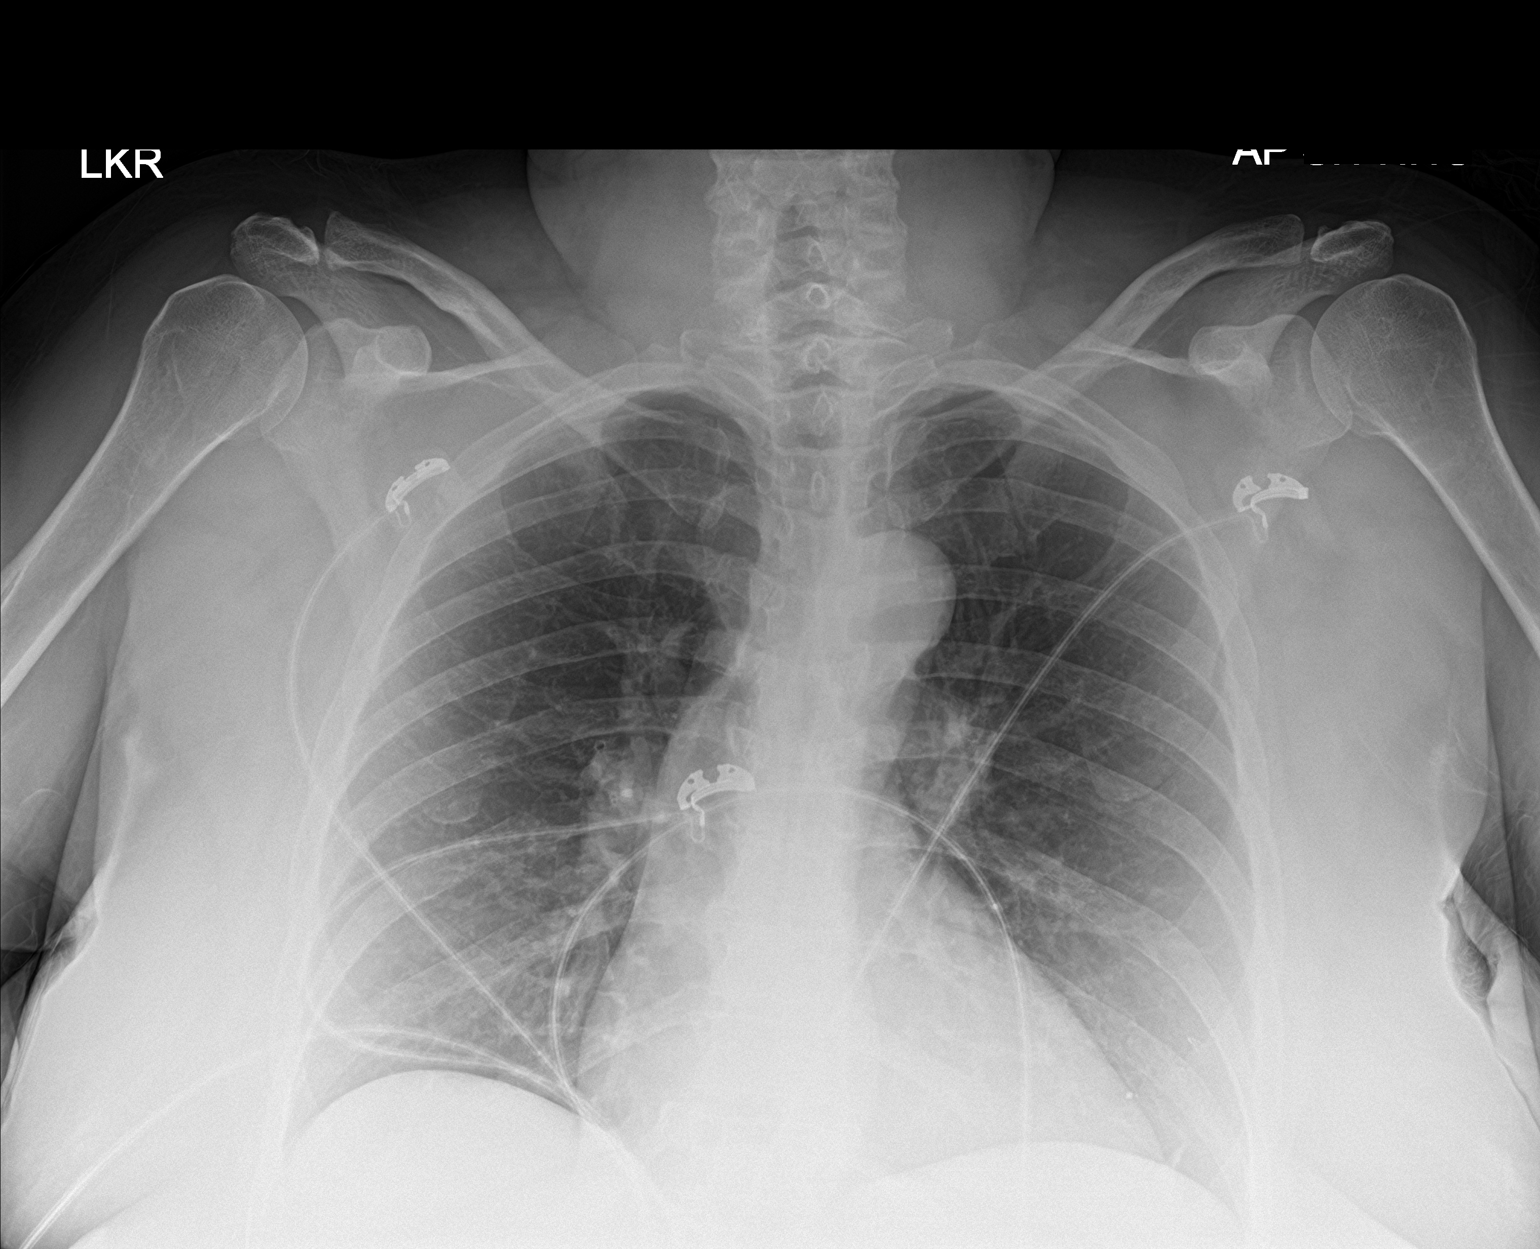

[2 of 2 positions shown; findings below may reference images not displayed]

FINDINGS: There is no edema or consolidation. The heart size and pulmonary
vascularity are normal. No adenopathy. There is postoperative change
in the left breast region. No bone lesions.
IMPRESSION: No edema or consolidation.

## 2020-02-26 IMAGING — CT CT ANGIO CHEST
2 of 6 series · 18 of 46 positions shown · IV contrast (APPLIED)
Comparison: CT scan December 05, 2017.

CLINICAL DATA: Chest pain.  Shortness of breath.

EXAM:
CT ANGIOGRAPHY CHEST WITH CONTRAST
TECHNIQUE: Multidetector CT imaging of the chest was performed using the
standard protocol during bolus administration of intravenous
contrast. Multiplanar CT image reconstructions and MIPs were
obtained to evaluate the vascular anatomy.
CONTRAST:  75mL OMNIPAQUE IOHEXOL 350 MG/ML SOLN

[Series 5: thins · axial · 0.62mm/px · z∈[-164,+80]mm · 16 of 268 slices shown]
[im 12/268  lung]
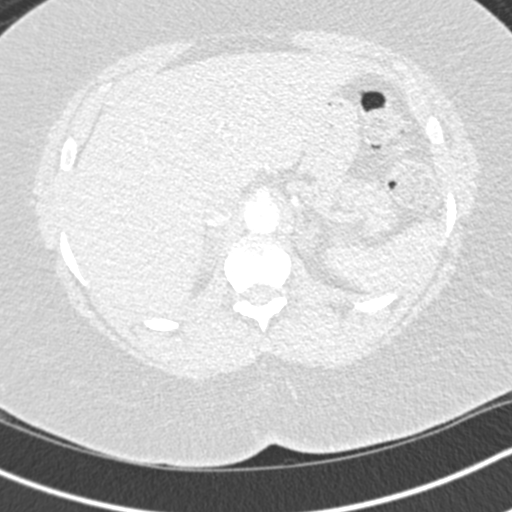
[im 35/268  soft-tissue]
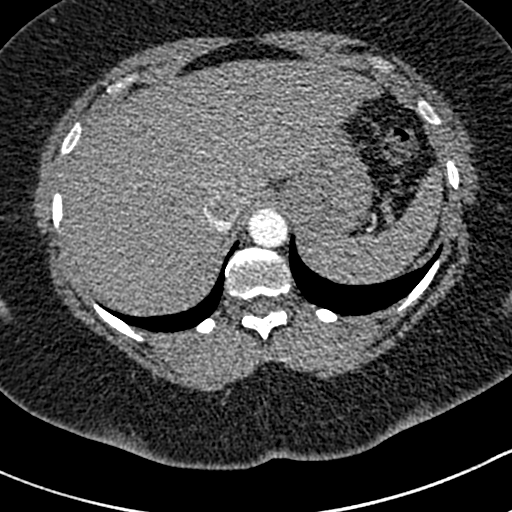
[im 47/268  lung]
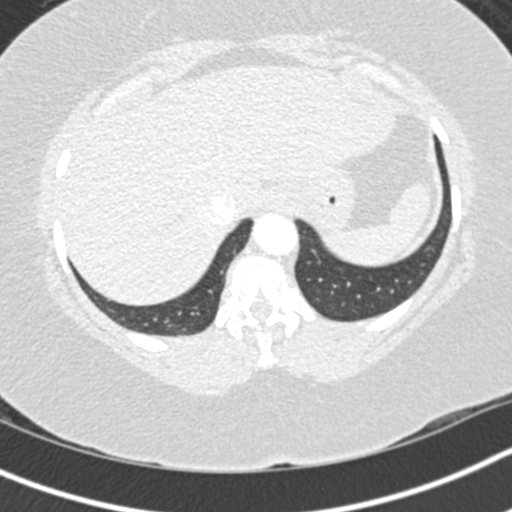
[im 59/268  soft-tissue]
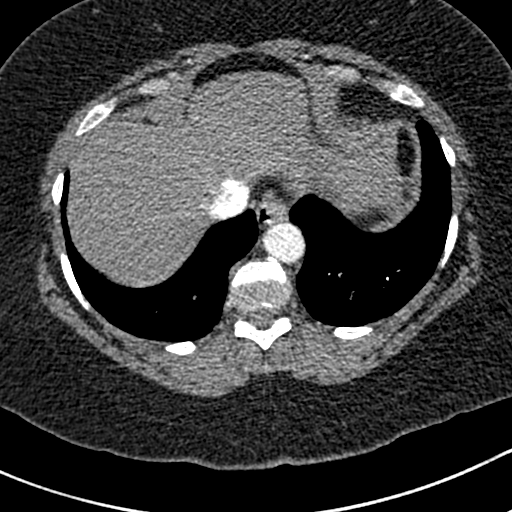
[im 82/268  lung]
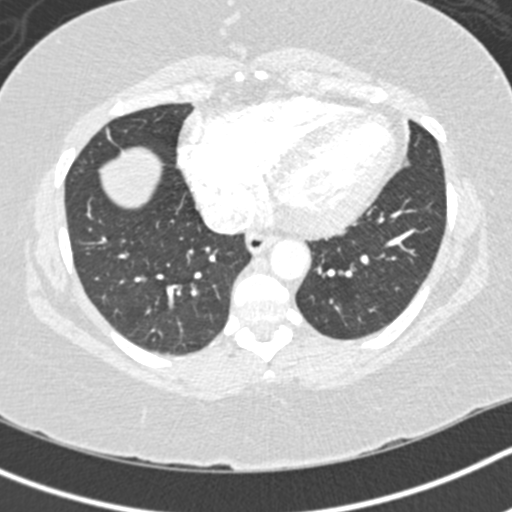
[im 93/268  soft-tissue]
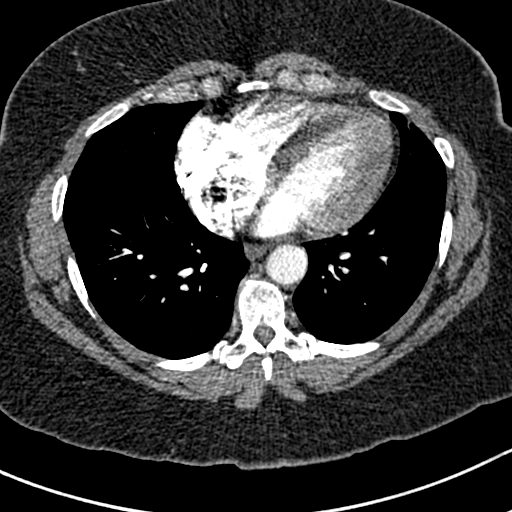
[im 105/268  lung]
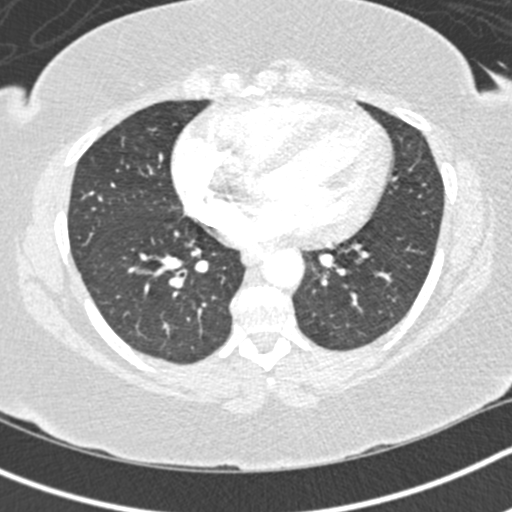
[im 128/268  soft-tissue]
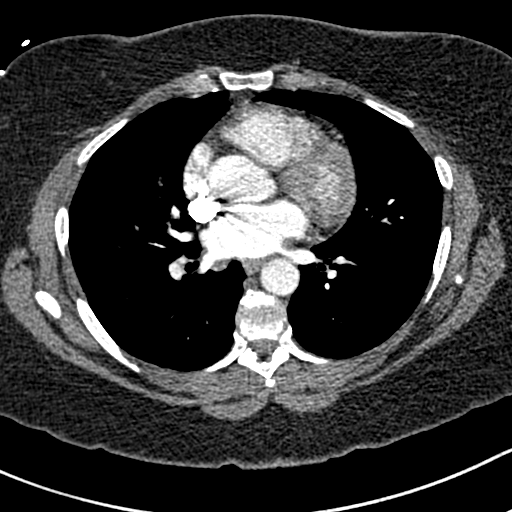
[im 140/268  lung]
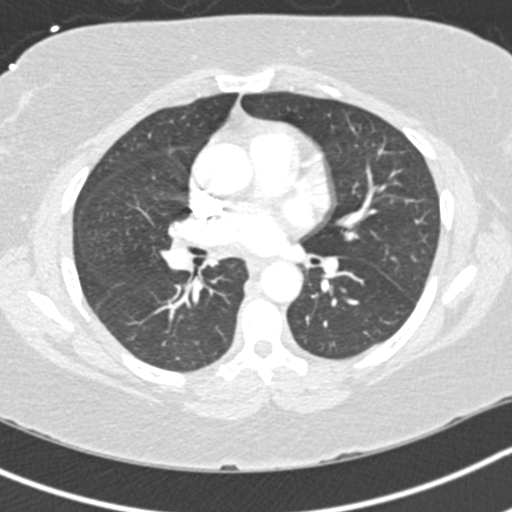
[im 163/268  soft-tissue]
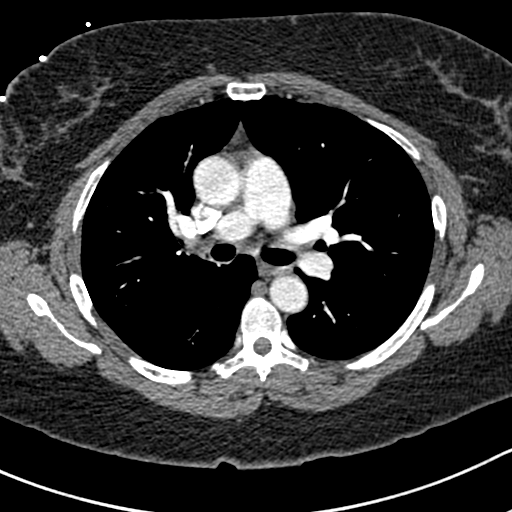
[im 175/268  lung]
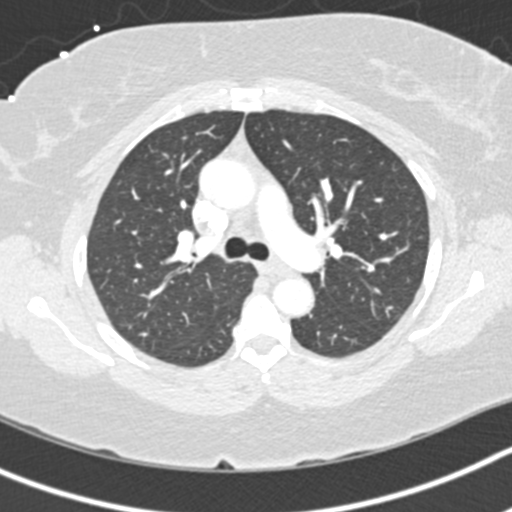
[im 186/268  soft-tissue]
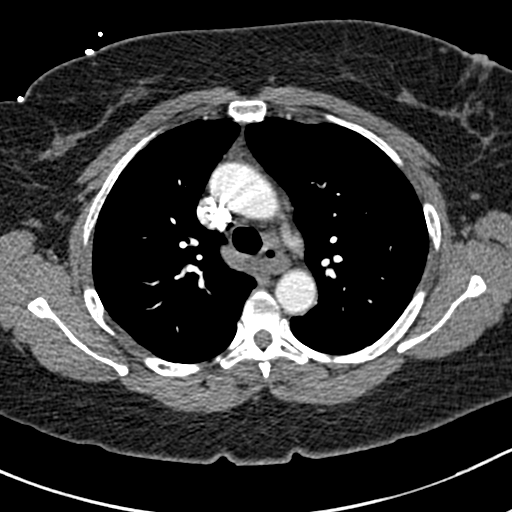
[im 209/268  lung]
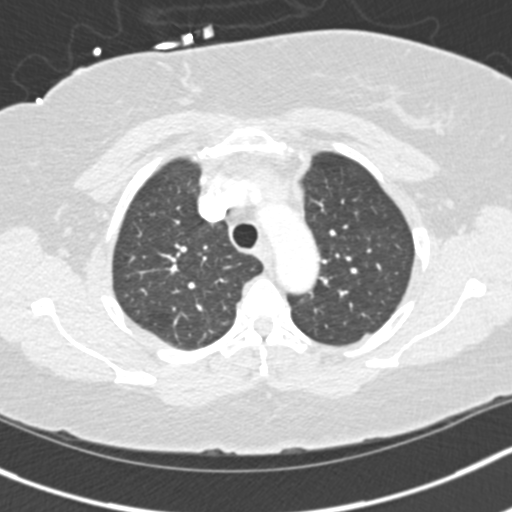
[im 221/268  soft-tissue]
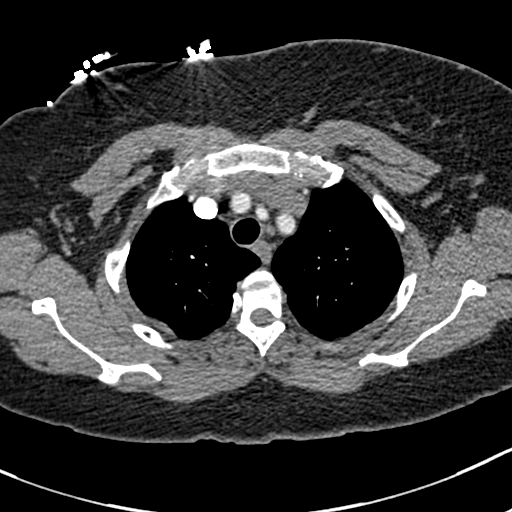
[im 233/268  lung]
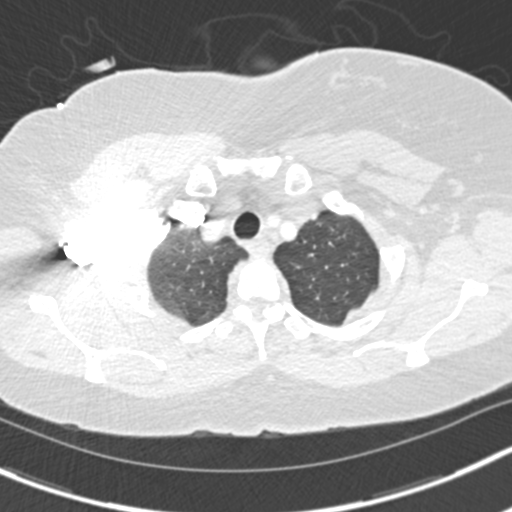
[im 256/268  soft-tissue]
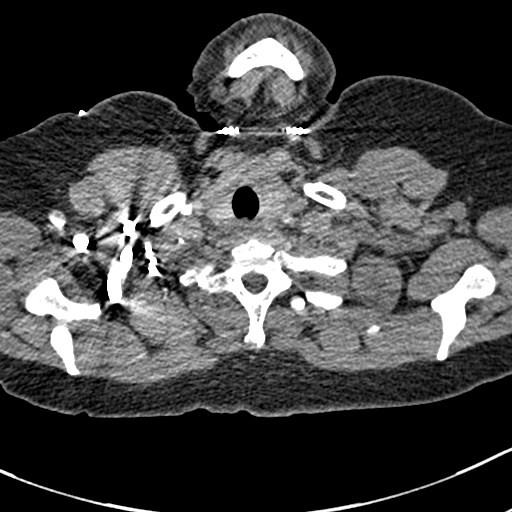

[Series 7: coronal mpr · coronal · 0.53mm/px · 2 of 88 slices shown]
[im 30/88  soft-tissue]
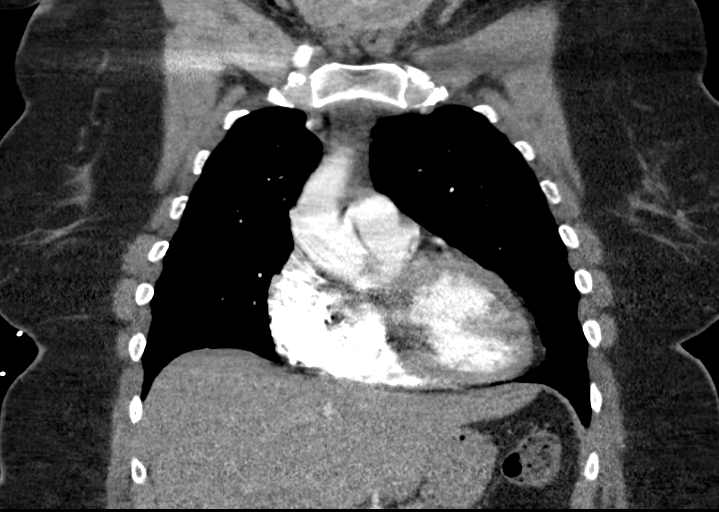
[im 59/88  soft-tissue]
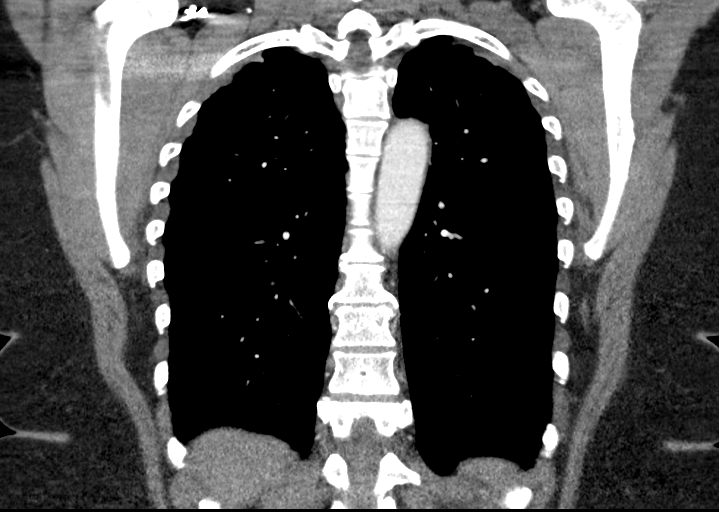

[18 of 46 positions shown; findings below may reference images not displayed]

FINDINGS: Cardiovascular: The thoracic aorta is normal. No coronary artery
calcifications identified. The heart size is borderline. Evaluation
of peripheral pulmonary arteries is somewhat limited due to
respiratory motion. No pulmonary emboli identified.

Mediastinum/Nodes: No enlarged mediastinal, hilar, or axillary lymph
nodes. Thyroid gland, trachea, and esophagus demonstrate no
significant findings.

Lungs/Pleura: Central airways are normal. No pneumothorax. No
pulmonary nodule, mass, or focal infiltrate.

Upper Abdomen: No acute abnormality.

Musculoskeletal: No chest wall abnormality. No acute or significant
osseous findings.

Review of the MIP images confirms the above findings.
IMPRESSION: 1. No central pulmonary emboli. Evaluation of peripheral branches is
somewhat limited due to respiratory motion.
2. The lungs are clear.
3. No cause for pain identified.

## 2020-03-12 ENCOUNTER — Ambulatory Visit: Payer: Medicaid Other | Admitting: Student in an Organized Health Care Education/Training Program

## 2020-03-18 ENCOUNTER — Ambulatory Visit: Payer: Medicaid Other | Admitting: Dietician

## 2020-03-24 ENCOUNTER — Encounter: Payer: Medicaid Other | Attending: Internal Medicine | Admitting: Dietician

## 2020-03-24 ENCOUNTER — Other Ambulatory Visit: Payer: Self-pay

## 2020-03-24 ENCOUNTER — Encounter: Payer: Self-pay | Admitting: Dietician

## 2020-03-24 DIAGNOSIS — Z713 Dietary counseling and surveillance: Secondary | ICD-10-CM | POA: Diagnosis present

## 2020-03-24 DIAGNOSIS — Z6835 Body mass index (BMI) 35.0-35.9, adult: Secondary | ICD-10-CM | POA: Diagnosis not present

## 2020-03-24 DIAGNOSIS — E6609 Other obesity due to excess calories: Secondary | ICD-10-CM | POA: Insufficient documentation

## 2020-03-24 NOTE — Patient Instructions (Signed)
   Have a conversation with your doctor about if there are any safe weight loss medications that will not negatively impact your heart health   Continue to drink plenty of water and keep salt consumption low  Continue to keep control carb intake  2-3 servings at meals, 1-2 servings at snacks  Call when you are ready to make another follow up appointment

## 2020-03-24 NOTE — Progress Notes (Signed)
Medical Nutrition Therapy: Visit start time: 0900  end time: 945  Assessment:  Diagnosis: obesity Medical history changes: HTN, GERD, sleep apnea Psychosocial issues/ stress concerns: none  Current weight: 273.0 lbs  Height: 5'1" Medications, supplement changes: reconciled in medical record   Progress and evaluation:  . Pt reports occasionally SOB when going to the restroom or walking around at home   . Pt reports no longer using salt, has switched to Mrs. Dash  . Pt reports trying manage stress by going on a ride or listening to music, instead of with sweets    Physical activity: several jobs that are physically demanding; pt reports more frequently feeling winded and in pain when trying to walk with [place] ice cart   Dietary Intake:  Usual eating pattern includes 1-2 meals and 1-2 snacks per day.  Breakfast:  Usually skips meal; cheese and applesauce cup; bacon and eggs Snack: bag of walnuts, blueberries, applesauce, fruit cups and yogurt Lunch: green beans, collard greens, or salad with grilled or fried chicken  Snack: same as above Supper(tries not to eat after 8p): take out chicken wings (no breading and no seasoning) with salad;   Snack: occasionally has cake or ice cream when stressed out  Beverages: 10-12 cups water a day, and coffee  Intervention:  Pt has made some diet changes but still not consistently eating three meals a day  Discussed portion controlled snacks that can fit into her nutrition plan to help with sugar cravings ([employer] snacks, SF ice cream)  Pt reports that doctor said that phentermine may not be good for her heart; discussed link between heart health and pt's history of hypertension; and nutrition strategies that may improve heart health   Pt plans on asking doctor to see if there are any other safe weight loss medications that she can try   Discussed benefits of making changes; pt can identify healthy habits but reports that significant other is  often a negative influence (brings home sweets and chips, discourages physical activity, smokes in the home)  Education Materials given:  Marland Kitchen Mindful eating: Is it time to eat flow sheet  . 101 things to do besides eat  . Goals/ instructions  Learner/ who was taught:  . Patient   Level of understanding: Marland Kitchen Verbalizes/ demonstrates competency  Demonstrated degree of understanding via:   Teach back Learning barriers: . None  Willingness to learn/ readiness for change: . Eager, change in progress  Monitoring and Evaluation:  Dietary intake, exercise, and body weight      follow up: PRN

## 2020-04-11 ENCOUNTER — Other Ambulatory Visit: Payer: Self-pay

## 2020-04-11 DIAGNOSIS — R102 Pelvic and perineal pain: Secondary | ICD-10-CM | POA: Insufficient documentation

## 2020-04-11 DIAGNOSIS — R350 Frequency of micturition: Secondary | ICD-10-CM | POA: Diagnosis not present

## 2020-04-11 LAB — COMPREHENSIVE METABOLIC PANEL
ALT: 16 U/L (ref 0–44)
AST: 21 U/L (ref 15–41)
Albumin: 4.1 g/dL (ref 3.5–5.0)
Alkaline Phosphatase: 77 U/L (ref 38–126)
Anion gap: 7 (ref 5–15)
BUN: 22 mg/dL — ABNORMAL HIGH (ref 6–20)
CO2: 28 mmol/L (ref 22–32)
Calcium: 9.6 mg/dL (ref 8.9–10.3)
Chloride: 106 mmol/L (ref 98–111)
Creatinine, Ser: 1.3 mg/dL — ABNORMAL HIGH (ref 0.44–1.00)
GFR calc Af Amer: 53 mL/min — ABNORMAL LOW (ref >60)
GFR calc non Af Amer: 46 mL/min — ABNORMAL LOW (ref >60)
Glucose, Bld: 92 mg/dL (ref 70–99)
Potassium: 3.6 mmol/L (ref 3.5–5.1)
Sodium: 141 mmol/L (ref 135–145)
Total Bilirubin: 0.6 mg/dL (ref 0.3–1.2)
Total Protein: 7.7 g/dL (ref 6.5–8.1)

## 2020-04-11 LAB — CBC
HCT: 33.8 % — ABNORMAL LOW (ref 36.0–46.0)
Hemoglobin: 11.1 g/dL — ABNORMAL LOW (ref 12.0–15.0)
MCH: 30.6 pg (ref 26.0–34.0)
MCHC: 32.8 g/dL (ref 30.0–36.0)
MCV: 93.1 fL (ref 80.0–100.0)
Platelets: 248 10*3/uL (ref 150–400)
RBC: 3.63 MIL/uL — ABNORMAL LOW (ref 3.87–5.11)
RDW: 13.5 % (ref 11.5–15.5)
WBC: 6.4 10*3/uL (ref 4.0–10.5)
nRBC: 0 % (ref 0.0–0.2)

## 2020-04-11 LAB — LIPASE, BLOOD: Lipase: 34 U/L (ref 11–51)

## 2020-04-11 LAB — URINALYSIS, COMPLETE (UACMP) WITH MICROSCOPIC
Bilirubin Urine: NEGATIVE
Glucose, UA: NEGATIVE mg/dL
Ketones, ur: NEGATIVE mg/dL
Leukocytes,Ua: NEGATIVE
Nitrite: NEGATIVE
Protein, ur: NEGATIVE mg/dL
Specific Gravity, Urine: 1.021 (ref 1.005–1.030)
pH: 5 (ref 5.0–8.0)

## 2020-04-11 NOTE — ED Triage Notes (Signed)
Patient c/o vaginal pain radiating into lower abdomen X 4 days. Patient denies discharge. Patient reports frequent urination.

## 2020-04-12 ENCOUNTER — Emergency Department
Admission: EM | Admit: 2020-04-12 | Discharge: 2020-04-12 | Discharge status: Against Medical Advice | Payer: Medicaid Other | Attending: Emergency Medicine | Admitting: Emergency Medicine

## 2020-04-12 NOTE — ED Notes (Signed)
Patient reported to front desk staff that she was leaving. Patient encouraged to stay. Patient left.

## 2020-05-30 ENCOUNTER — Encounter: Payer: Self-pay | Admitting: Dietician

## 2020-10-02 DIAGNOSIS — H538 Other visual disturbances: Secondary | ICD-10-CM | POA: Insufficient documentation

## 2020-10-02 DIAGNOSIS — H43393 Other vitreous opacities, bilateral: Secondary | ICD-10-CM | POA: Insufficient documentation

## 2020-10-02 DIAGNOSIS — H43391 Other vitreous opacities, right eye: Secondary | ICD-10-CM | POA: Insufficient documentation

## 2021-02-10 ENCOUNTER — Emergency Department
Admission: EM | Admit: 2021-02-10 | Discharge: 2021-02-10 | Disposition: A | Discharge status: Home / Self Care | Payer: Medicaid Other | Attending: Emergency Medicine | Admitting: Emergency Medicine

## 2021-02-10 ENCOUNTER — Other Ambulatory Visit: Payer: Self-pay

## 2021-02-10 ENCOUNTER — Emergency Department: Payer: Medicaid Other

## 2021-02-10 DIAGNOSIS — R002 Palpitations: Secondary | ICD-10-CM | POA: Diagnosis present

## 2021-02-10 DIAGNOSIS — E611 Iron deficiency: Secondary | ICD-10-CM | POA: Insufficient documentation

## 2021-02-10 DIAGNOSIS — Z85528 Personal history of other malignant neoplasm of kidney: Secondary | ICD-10-CM | POA: Diagnosis not present

## 2021-02-10 DIAGNOSIS — I493 Ventricular premature depolarization: Secondary | ICD-10-CM | POA: Insufficient documentation

## 2021-02-10 DIAGNOSIS — J45901 Unspecified asthma with (acute) exacerbation: Secondary | ICD-10-CM | POA: Diagnosis not present

## 2021-02-10 DIAGNOSIS — E876 Hypokalemia: Secondary | ICD-10-CM | POA: Insufficient documentation

## 2021-02-10 DIAGNOSIS — Z87891 Personal history of nicotine dependence: Secondary | ICD-10-CM | POA: Diagnosis not present

## 2021-02-10 DIAGNOSIS — Z79899 Other long term (current) drug therapy: Secondary | ICD-10-CM | POA: Insufficient documentation

## 2021-02-10 DIAGNOSIS — J449 Chronic obstructive pulmonary disease, unspecified: Secondary | ICD-10-CM | POA: Insufficient documentation

## 2021-02-10 DIAGNOSIS — I10 Essential (primary) hypertension: Secondary | ICD-10-CM | POA: Insufficient documentation

## 2021-02-10 LAB — HEPATIC FUNCTION PANEL
ALT: 13 U/L (ref 0–44)
AST: 19 U/L (ref 15–41)
Albumin: 3.9 g/dL (ref 3.5–5.0)
Alkaline Phosphatase: 73 U/L (ref 38–126)
Bilirubin, Direct: 0.1 mg/dL (ref 0.0–0.2)
Indirect Bilirubin: 0.5 mg/dL (ref 0.3–0.9)
Total Bilirubin: 0.6 mg/dL (ref 0.3–1.2)
Total Protein: 7.3 g/dL (ref 6.5–8.1)

## 2021-02-10 LAB — CBC
HCT: 32.6 % — ABNORMAL LOW (ref 36.0–46.0)
Hemoglobin: 10.7 g/dL — ABNORMAL LOW (ref 12.0–15.0)
MCH: 30.2 pg (ref 26.0–34.0)
MCHC: 32.8 g/dL (ref 30.0–36.0)
MCV: 92.1 fL (ref 80.0–100.0)
Platelets: 242 10*3/uL (ref 150–400)
RBC: 3.54 MIL/uL — ABNORMAL LOW (ref 3.87–5.11)
RDW: 14.1 % (ref 11.5–15.5)
WBC: 6.5 10*3/uL (ref 4.0–10.5)
nRBC: 0 % (ref 0.0–0.2)

## 2021-02-10 LAB — D-DIMER, QUANTITATIVE: D-Dimer, Quant: 0.43 ug/mL-FEU (ref 0.00–0.50)

## 2021-02-10 LAB — BASIC METABOLIC PANEL
Anion gap: 7 (ref 5–15)
BUN: 17 mg/dL (ref 6–20)
CO2: 26 mmol/L (ref 22–32)
Calcium: 8.9 mg/dL (ref 8.9–10.3)
Chloride: 105 mmol/L (ref 98–111)
Creatinine, Ser: 1.08 mg/dL — ABNORMAL HIGH (ref 0.44–1.00)
GFR, Estimated: >60 mL/min (ref >60)
Glucose, Bld: 116 mg/dL — ABNORMAL HIGH (ref 70–99)
Potassium: 3 mmol/L — ABNORMAL LOW (ref 3.5–5.1)
Sodium: 138 mmol/L (ref 135–145)

## 2021-02-10 LAB — MAGNESIUM: Magnesium: 1.7 mg/dL (ref 1.7–2.4)

## 2021-02-10 LAB — TSH: TSH: 1.408 u[IU]/mL (ref 0.350–4.500)

## 2021-02-10 LAB — T4, FREE: Free T4: 1.01 ng/dL (ref 0.61–1.12)

## 2021-02-10 LAB — TROPONIN I (HIGH SENSITIVITY): Troponin I (High Sensitivity): 6 ng/L (ref <18)

## 2021-02-10 MED ORDER — POTASSIUM CHLORIDE CRYS ER 20 MEQ PO TBCR: 20.0000 meq | EXTENDED_RELEASE_TABLET | Freq: Every day | ORAL | 0 refills | Status: AC

## 2021-02-10 MED ORDER — POTASSIUM CHLORIDE CRYS ER 20 MEQ PO TBCR
40.0000 meq | EXTENDED_RELEASE_TABLET | Freq: Once | ORAL | Status: AC
  Administered 2021-02-10: 40 meq via ORAL
  Filled 2021-02-10: qty 2

## 2021-02-10 NOTE — Discharge Instructions (Signed)
Take the potassium pills in case this can help decrease your amount of palpitations.  Call the cardiology doctor to get a follow-up appointment for Holter monitor.  Return to the ER if you develop worsening symptoms or any other concerns

## 2021-02-10 NOTE — ED Provider Notes (Signed)
[facility] Emergency Department Provider Note  ____________________________________________   Event Date/Time   First MD Initiated Contact with Patient 02/10/21 1818     (approximate)  I have reviewed the triage vital signs and the nursing notes.   HISTORY  Chief Complaint Palpitations and Loss of Consciousness    HPI Wendy Ramos is a 55 y.o. female with COPD, hypertension, prior kidney cancer who comes in with palpitations.  Patient reports having palpitations for some years now but states that they have gotten worse more recently.  She states that they are happening intermittently throughout the day, nothing makes them better, worse with exertion.  Patient states that they used to we thought that they are from her being on phentermine but she states that she had the palpitations even when not on it.  She denies that she been taking it currently.  Patient reports LOC episode that happened on Sunday in which she ate something from a food truck and then was having diarrhea and felt clammy and lightheaded and then had positive LOC.  Did not hit her head did not lose consciousness.  Patient reports having a Holter monitor previously but that was when she was not having the symptoms as often and did not find anything.  She does report a little bit of shortness of breath as well associate with the palpitations that seems to be getting worse with exertion.  She denies a history of COPD although is listed in her chart  Patient does report some black stools but patient states that she is on iron.    To note patient was seen on 4/21 by her primary care doctor.  Patient's been on phentermine.  Patient has also been seen by cardiology with normal echocardiogram          Past Medical History:  Diagnosis Date  . Asthma   . Cancer [facility])    kidney cancer- 2014;   . COPD (chronic obstructive pulmonary disease) ([place])   . Hypertension     Patient Active Problem List    Diagnosis Date Noted  . Influenza A 12/04/2017  . Acute respiratory failure ([place]) 12/04/2017  . Chest pain 12/04/2017  . Asthma exacerbation 09/10/2017    Past Surgical History:  Procedure Laterality Date  . ABDOMINAL HYSTERECTOMY    . CHOLECYSTECTOMY    . KIDNEY SURGERY      Prior to Admission medications   Medication Sig Start Date End Date Taking? Authorizing Provider  amLODipine (NORVASC) 10 MG tablet Take 10 mg by mouth daily. 02/24/17   [provider]  budesonide-formoterol (SYMBICORT) 80-4.5 MCG/ACT inhaler Inhale 2 puffs into the lungs 2 (two) times daily. 04/02/18 04/02/19  [provider]  cetirizine (ZYRTEC) 10 MG tablet Take 10 mg by mouth daily.  07/15/17   [provider]  chlorpheniramine-HYDROcodone ([place]) 10-8 MG/5ML SUER SMARTSIG:5 Milliliter(s) By Mouth Every 12 Hours PRN 09/11/19   [provider]  cyclobenzaprine (FLEXERIL) 10 MG tablet Take 10 mg by mouth 3 (three) times daily as needed. 11/21/19   [provider]  famotidine (PEPCID) 20 MG tablet Take 20 mg by mouth 2 (two) times daily.     [provider]  fluticasone (FLONASE) 50 MCG/ACT nasal spray SMARTSIG:2 Spray(s) Both Nares Every Night 11/21/19   [provider]  ipratropium (ATROVENT) 0.02 % nebulizer solution Inhale into the lungs. 04/02/18   [provider]  montelukast (SINGULAIR) 10 MG tablet Take by mouth. 11/01/19 10/31/20  [provider]  nicotine polacrilex (NICORETTE) 2 MG gum Take 2 mg by mouth as needed for smoking cessation.    [provider]  omeprazole (PRILOSEC) 40 MG capsule Take 40 mg by mouth 2 (two) times daily.    [provider]  potassium chloride (K-DUR) 10 MEQ tablet Take 10 mEq by mouth 2 (two) times daily.  03/08/17   [provider]  predniSONE (DELTASONE) 20 MG tablet Take 20 mg by mouth 2 (two) times daily. 09/12/19   [provider]  PREMARIN vaginal cream  SMARTSIG:0.5 Gram(s) Vaginal Daily 11/16/19   [provider]  PROAIR HFA 108 (90 Base) MCG/ACT inhaler Inhale 2 puffs into the lungs every 6 (six) hours as needed for wheezing. 06/19/17   [provider]  topiramate (TOPAMAX) 50 MG tablet Take 50 mg by mouth 2 (two) times daily. 11/16/19   [provider]  trospium (SANCTURA) 20 MG tablet Take by mouth. 12/27/19   [provider]    Allergies Beef (bovine) protein, Chicken allergy, Chocolate flavor, Fish-derived products, Metronidazole, Beef-derived products, Cefazolin, Galactose, Iopamidol, Fish allergy, and Contrast media  [iodinated diagnostic agents]  Family History  Problem Relation Age of Onset  . Hypertension Mother   . Heart disease Mother     Social History Social History   Tobacco Use  . Smoking status: Former Smoker    Packs/day: 0.50    Types: Cigarettes    Quit date: 10/11/2018    Years since quitting: 2.3  . Smokeless tobacco: Never Used  . Tobacco comment: states she smokes marijuana, Will give cessation materials upon return  Vaping Use  . Vaping Use: Never used  Substance Use Topics  . Alcohol use: No  . Drug use: No      Review of Systems Constitutional: No fever/chills Eyes: No visual changes. ENT: No sore throat. Cardiovascular: Positive palpitations Respiratory: Positive shortness of breath Gastrointestinal: No abdominal pain.  No nausea, no vomiting.  No diarrhea.  No constipation.  Black stools Genitourinary: Negative for dysuria. Musculoskeletal: Negative for back pain. Skin: Negative for rash. Neurological: Negative for headaches, focal weakness or numbness. All other ROS negative ____________________________________________   PHYSICAL EXAM:  VITAL SIGNS: ED Triage Vitals  Blood pressure 131/77, pulse 67, temperature 98.1 F (36.7 C), temperature source Oral, resp. rate (!) 22, height 5\' 2"  (1.575 m), weight 117.5 kg, SpO2 100 %.   Constitutional: Alert  and oriented. Well appearing and in no acute distress. Eyes: Conjunctivae are normal. EOMI. Head: Atraumatic. Nose: No congestion/rhinnorhea. Mouth/Throat: Mucous membranes are moist.   Neck: No stridor. Trachea Midline. FROM Cardiovascular: Normal rate, regular rhythm. Grossly normal heart sounds.  Good peripheral circulation. Respiratory: Normal respiratory effort.  No retractions. Lungs CTAB. Gastrointestinal: Soft and nontender. No distention. No abdominal bruits.  Musculoskeletal: No lower extremity tenderness nor edema.  No joint effusions. Neurologic:  Normal speech and language. No gross focal neurologic deficits are appreciated.  Skin:  Skin is warm, dry and intact. No rash noted. Psychiatric: Mood and affect are normal. Speech and behavior are normal. GU: Deferred   ____________________________________________   LABS (all labs ordered are listed, but only abnormal results are displayed)  Labs Reviewed  CBC - Abnormal; Notable for the following components:      Result Value   RBC 3.54 (*)    Hemoglobin 10.7 (*)    HCT 32.6 (*)    All other components within normal limits  BASIC METABOLIC PANEL  TSH  T4, FREE  MAGNESIUM  HEPATIC  FUNCTION PANEL  D-DIMER, QUANTITATIVE  POC URINE PREG, ED  TROPONIN I (HIGH SENSITIVITY)   ____________________________________________   ED ECG REPORT I, Vanessa [place], the attending physician, personally viewed and interpreted this ECG.  EKG is sinus arrhythmia rate of 73, no ST elevation, no T wave inversions, PVCs, normal intervals ____________________________________________  RADIOLOGY Robert Bellow, personally viewed and evaluated these images (plain radiographs) as part of my medical decision making, as well as reviewing the written report by the radiologist.  ED MD interpretation: No pneumonia  Official radiology report(s): DG Chest 2 View  Result Date: 02/10/2021 CLINICAL DATA:  Syncope and palpitations EXAM: CHEST - 2  VIEW COMPARISON:  November 08, 2018 FINDINGS: The heart size and mediastinal contours are within normal limits. No focal consolidation. No pleural effusion. No pneumothorax. The visualized skeletal structures are unremarkable. IMPRESSION: No active cardiopulmonary disease. Electronically Signed   By: Dahlia Bailiff MD   On: 02/10/2021 19:21    ____________________________________________   PROCEDURES  Procedure(s) performed (including Critical Care):  .1-3 Lead EKG Interpretation Performed by: Vanessa [place], MD Authorized by: Vanessa [place], MD     Interpretation: abnormal     ECG rate:  70s   ECG rate assessment: normal     Rhythm: sinus rhythm     Ectopy: PVCs     Conduction: normal       ____________________________________________   INITIAL IMPRESSION / ASSESSMENT AND PLAN / ED COURSE  Kenyonna Micek was evaluated in Emergency Department on 02/10/2021 for the symptoms described in the history of present illness. She was evaluated in the context of the global COVID-19 pandemic, which necessitated consideration that the patient might be at risk for infection with the SARS-CoV-2 virus that causes COVID-19. Institutional protocols and algorithms that pertain to the evaluation of patients at risk for COVID-19 are in a state of rapid change based on information released by regulatory bodies including the CDC and federal and state organizations. These policies and algorithms were followed during the patient's care in the ED.    We will keep patient on the cardiac monitor to evaluate for arrhythmia.  When I have reviewed her strips she does have frequent PVCs.  Will get labs to evaluate for Electra abnormalities, thyroid dysfunction, ACS, PE.  Suspect that her symptoms are from the PVCs.  Patient syncopal episode a few days ago sounds more like vasovagal in nature.  Labs are reassuring.  Hemoglobin is around baseline at 10.7.  Patient has known iron deficiency.  To do a rectal exam and there  was not a ton of stool in the vault but I did get looked brown and was Hemoccult negative.  Therefore I suspect that this is most likely related to her iron pills.  Her potassium is low at 3.0 and I given her some oral repletion and will send her home on some oral potassium.  Discussed with patient that I suspect that her symptoms are from PVCs.  However I did recommend that she follow-up with a cardiologist to get a Holter monitor to make sure there is not anything else more threatening going on.  Patient expressed understanding felt comfortable with this plan  I discussed the provisional nature of ED diagnosis, the treatment so far, the ongoing plan of care, follow up appointments and return precautions with the patient and any family or support people present. They expressed understanding and agreed with the plan, discharged home.    ____________________________________________   FINAL CLINICAL IMPRESSION(S) /  ED DIAGNOSES   Final diagnoses:  Palpitations  PVC's (premature ventricular contractions)  Hypokalemia      MEDICATIONS GIVEN DURING THIS VISIT:  Medications  potassium chloride SA (KLOR-CON) CR tablet 40 mEq (has no administration in time range)     ED Discharge Orders         Ordered    potassium chloride SA (KLOR-CON) 20 MEQ tablet  Daily        02/10/21 2123           Note:  This document was prepared using Dragon voice recognition software and may include unintentional dictation errors.   Vanessa [place], MD 02/10/21 2132

## 2021-02-10 NOTE — ED Triage Notes (Signed)
Pt presents to the [facility] with c/o palpitations and syncope. Pt states that she has had intermittent episodes of palpitations ongoing for several weeks and seems to be progressively worsening. Pt also reports an episode of syncope on Saturday. Pt denies chest pain or shortness of breath at this time. Pt currently A&Ox4 with stable vital signs.

## 2021-06-02 ENCOUNTER — Other Ambulatory Visit: Payer: Self-pay

## 2021-06-02 ENCOUNTER — Ambulatory Visit: Payer: Medicaid Other | Admitting: Podiatry

## 2021-06-02 ENCOUNTER — Ambulatory Visit (INDEPENDENT_AMBULATORY_CARE_PROVIDER_SITE_OTHER): Payer: Medicaid Other

## 2021-06-02 DIAGNOSIS — M778 Other enthesopathies, not elsewhere classified: Secondary | ICD-10-CM

## 2021-06-02 DIAGNOSIS — C649 Malignant neoplasm of unspecified kidney, except renal pelvis: Secondary | ICD-10-CM | POA: Insufficient documentation

## 2021-06-02 DIAGNOSIS — M7751 Other enthesopathy of right foot: Secondary | ICD-10-CM

## 2021-06-02 MED ORDER — BETAMETHASONE SOD PHOS & ACET 6 (3-3) MG/ML IJ SUSP
3.0000 mg | Freq: Once | INTRAMUSCULAR | Status: AC
  Administered 2021-06-02: 3 mg via INTRA_ARTICULAR

## 2021-06-02 MED ORDER — METHYLPREDNISOLONE 4 MG PO TBPK: ORAL_TABLET | ORAL | 0 refills | Status: AC

## 2021-06-02 NOTE — Progress Notes (Signed)
   HPI: 55 y.o. female presenting today for evaluation of right foot pain is been going on for about 3 years now intermittently.  Patient states that she has pain and tenderness to the lateral side of the foot.  She states that she has been treated and in and out of the boot in the past with no help.  She cannot recall any other treatment modalities for her foot.  She presents for further treatment and evaluation  Past Medical History:  Diagnosis Date   Asthma    Cancer ([place])    kidney cancer- 2014;    COPD (chronic obstructive pulmonary disease) ([place])    Hypertension      Physical Exam: General: The patient is alert and oriented x3 in no acute distress.  Dermatology: Skin is warm, dry and supple bilateral lower extremities. Negative for open lesions or macerations.  Vascular: Palpable pedal pulses bilaterally. No edema or erythema noted. Capillary refill within normal limits.  Neurological: Epicritic and protective threshold grossly intact bilaterally.   Musculoskeletal Exam: No pedal deformities noted.  Range of motion within normal limits to all pedal and ankle joints bilateral. Muscle strength 5/5 in all groups bilateral.  Pain on palpation lateral aspect of the right midtarsal joint  Radiographic Exam:  Normal osseous mineralization.  There does appear to be some joint space narrowing with para-articular spurring throughout the midtarsal joint as well as the ankle joint best visualized on lateral and medial oblique views  Assessment: 1.  Capsulitis right midtarsal joint lateral aspect 2.  DJD right foot   Plan of Care:  1. Patient evaluated. X-Rays reviewed.  2.  Injection of 0.5 cc Celestone Soluspan injected into the lateral aspect of the midtarsal joint right foot 3.  Prescription for Medrol Dosepak 4.  Patient cannot take oral NSAIDs secondary to upset stomach 5.  Compression ankle sleeve dispensed.  Wear daily 6.  Return to clinic as needed       Edrick Kins,  DPM [facility]  Dr. Edrick Kins, DPM    [address], [place] [address]                Office [phone]  Fax [phone]

## 2021-11-16 ENCOUNTER — Emergency Department: Payer: Medicaid Other

## 2021-11-16 ENCOUNTER — Encounter: Payer: Self-pay | Admitting: Emergency Medicine

## 2021-11-16 ENCOUNTER — Other Ambulatory Visit: Payer: Self-pay

## 2021-11-16 ENCOUNTER — Emergency Department
Admission: EM | Admit: 2021-11-16 | Discharge: 2021-11-16 | Disposition: A | Discharge status: Home / Self Care | Payer: Medicaid Other | Attending: Emergency Medicine | Admitting: Emergency Medicine

## 2021-11-16 DIAGNOSIS — R110 Nausea: Secondary | ICD-10-CM | POA: Diagnosis not present

## 2021-11-16 DIAGNOSIS — R079 Chest pain, unspecified: Secondary | ICD-10-CM | POA: Diagnosis not present

## 2021-11-16 DIAGNOSIS — Z20822 Contact with and (suspected) exposure to covid-19: Secondary | ICD-10-CM | POA: Diagnosis not present

## 2021-11-16 DIAGNOSIS — J45909 Unspecified asthma, uncomplicated: Secondary | ICD-10-CM | POA: Insufficient documentation

## 2021-11-16 DIAGNOSIS — I10 Essential (primary) hypertension: Secondary | ICD-10-CM | POA: Insufficient documentation

## 2021-11-16 DIAGNOSIS — R0602 Shortness of breath: Secondary | ICD-10-CM | POA: Insufficient documentation

## 2021-11-16 LAB — CBC
HCT: 36.9 % (ref 36.0–46.0)
Hemoglobin: 12 g/dL (ref 12.0–15.0)
MCH: 30.7 pg (ref 26.0–34.0)
MCHC: 32.5 g/dL (ref 30.0–36.0)
MCV: 94.4 fL (ref 80.0–100.0)
Platelets: 279 10*3/uL (ref 150–400)
RBC: 3.91 MIL/uL (ref 3.87–5.11)
RDW: 13.6 % (ref 11.5–15.5)
WBC: 5.4 10*3/uL (ref 4.0–10.5)
nRBC: 0 % (ref 0.0–0.2)

## 2021-11-16 LAB — URINALYSIS, ROUTINE W REFLEX MICROSCOPIC
Bilirubin Urine: NEGATIVE
Glucose, UA: NEGATIVE mg/dL
Ketones, ur: NEGATIVE mg/dL
Leukocytes,Ua: NEGATIVE
Nitrite: NEGATIVE
Protein, ur: NEGATIVE mg/dL
Specific Gravity, Urine: 1.025 (ref 1.005–1.030)
pH: 5 (ref 5.0–8.0)

## 2021-11-16 LAB — BASIC METABOLIC PANEL
Anion gap: 7 (ref 5–15)
BUN: 16 mg/dL (ref 6–20)
CO2: 26 mmol/L (ref 22–32)
Calcium: 9.3 mg/dL (ref 8.9–10.3)
Chloride: 104 mmol/L (ref 98–111)
Creatinine, Ser: 1.15 mg/dL — ABNORMAL HIGH (ref 0.44–1.00)
GFR, Estimated: 56 mL/min — ABNORMAL LOW (ref >60)
Glucose, Bld: 108 mg/dL — ABNORMAL HIGH (ref 70–99)
Potassium: 3.7 mmol/L (ref 3.5–5.1)
Sodium: 137 mmol/L (ref 135–145)

## 2021-11-16 LAB — HEPATIC FUNCTION PANEL
ALT: 13 U/L (ref 0–44)
AST: 16 U/L (ref 15–41)
Albumin: 4.1 g/dL (ref 3.5–5.0)
Alkaline Phosphatase: 96 U/L (ref 38–126)
Bilirubin, Direct: <0.1 mg/dL (ref 0.0–0.2)
Total Bilirubin: 0.4 mg/dL (ref 0.3–1.2)
Total Protein: 8.3 g/dL — ABNORMAL HIGH (ref 6.5–8.1)

## 2021-11-16 LAB — URINALYSIS, MICROSCOPIC (REFLEX): Bacteria, UA: NONE SEEN

## 2021-11-16 LAB — TROPONIN I (HIGH SENSITIVITY)
Troponin I (High Sensitivity): 3 ng/L (ref <18)
Troponin I (High Sensitivity): <2 ng/L (ref <18)

## 2021-11-16 LAB — RESP PANEL BY RT-PCR (FLU A&B, COVID) ARPGX2
Influenza A by PCR: NEGATIVE
Influenza B by PCR: NEGATIVE
SARS Coronavirus 2 by RT PCR: NEGATIVE

## 2021-11-16 LAB — D-DIMER, QUANTITATIVE: D-Dimer, Quant: 0.5 ug/mL-FEU (ref 0.00–0.50)

## 2021-11-16 LAB — LIPASE, BLOOD: Lipase: 33 U/L (ref 11–51)

## 2021-11-16 MED ORDER — LIDOCAINE 5 % EX PTCH: 1.0000 | MEDICATED_PATCH | Freq: Two times a day (BID) | CUTANEOUS | 0 refills | Status: AC

## 2021-11-16 MED ORDER — DIPHENHYDRAMINE HCL 50 MG/ML IJ SOLN: 50.0000 mg | Freq: Once | INTRAMUSCULAR | Status: DC

## 2021-11-16 MED ORDER — DIPHENHYDRAMINE HCL 25 MG PO CAPS: 50.0000 mg | ORAL_CAPSULE | Freq: Once | ORAL | Status: DC

## 2021-11-16 MED ORDER — OXYCODONE HCL 5 MG PO TABS
5.0000 mg | ORAL_TABLET | Freq: Once | ORAL | Status: AC
Start: 2021-11-16 — End: 2021-11-16
  Administered 2021-11-16: 5 mg via ORAL
  Filled 2021-11-16: qty 1

## 2021-11-16 MED ORDER — HYDROCORTISONE SOD SUC (PF) 250 MG IJ SOLR
200.0000 mg | Freq: Once | INTRAMUSCULAR | Status: DC
  Filled 2021-11-16: qty 200

## 2021-11-16 MED ORDER — ACETAMINOPHEN 500 MG PO TABS
1000.0000 mg | ORAL_TABLET | Freq: Once | ORAL | Status: AC
  Administered 2021-11-16: 1000 mg via ORAL
  Filled 2021-11-16: qty 2

## 2021-11-16 MED ORDER — LIDOCAINE 5 % EX PTCH
1.0000 | MEDICATED_PATCH | CUTANEOUS | Status: DC
  Administered 2021-11-16: 1 via TRANSDERMAL
  Filled 2021-11-16: qty 1

## 2021-11-16 NOTE — ED Provider Notes (Signed)
[facility] Provider Note    Event Date/Time   First MD Initiated Contact with Patient 11/16/21 [phone]     (approximate)   History   Chest Pain   HPI  Wendy Ramos is a 56 y.o. female with hypertension, asthma who comes in with chest pain.  Patient reports some left-sided chest pain that started over the weekend with some associated nausea.  Patient reports that the pain is more of a sharp stabbing sensation in her left chest underneath her breast.  It lasts for a few minutes and then goes away.  It does hurt when she breathes that she does have a little bit of associated shortness of breath with that.  She denies any known sick contacts.  Does report having her COVID vaccines.  Denies any new abdominal pain, leg swelling, history of blood clots.  Physical Exam   Triage Vital Signs: ED Triage Vitals  Enc Vitals Group     BP 11/16/21 0734 (!) 144/95     Pulse Rate 11/16/21 0734 77     Resp 11/16/21 0734 16     Temp 11/16/21 0734 98.2 F (36.8 C)     Temp Source 11/16/21 0734 Oral     SpO2 11/16/21 0734 98 %     Weight 11/16/21 0732 276 lb (125.2 kg)     Height 11/16/21 0732 5\' 4"  (1.626 m)     Head Circumference --      Peak Flow --      Pain Score 11/16/21 0732 7     Pain Loc --      Pain Edu? --      Excl. in [place]? --     Most recent vital signs: Vitals:   11/16/21 0734  BP: (!) 144/95  Pulse: 77  Resp: 16  Temp: 98.2 F (36.8 C)  SpO2: 98%     General: Awake, no distress.  CV:  Good peripheral perfusion.  Resp:  Normal effort.  Abd:  No distention.  Soft and nontender Other:  Legs without any swelling or calf tenderness.  She is got no rash underneath her left breast.  She is not tender with pressing underneath her left breast   ED Results / Procedures / Treatments   Labs (all labs ordered are listed, but only abnormal results are displayed) Labs Reviewed  RESP PANEL BY RT-PCR (FLU A&B, COVID) ARPGX2  BASIC METABOLIC PANEL  CBC   HEPATIC FUNCTION PANEL  D-DIMER, QUANTITATIVE  LIPASE, BLOOD  POC URINE PREG, ED  TROPONIN I (HIGH SENSITIVITY)     EKG  My interpretation of EKG:  Normal sinus rate of 89 without any ST elevation or T wave inversions, normal intervals  RADIOLOGY I have reviewed the xray personally and agree with radiology read negative   PROCEDURES:  Critical Care performed: No  .1-3 Lead EKG Interpretation Performed by: Vanessa [place], MD Authorized by: Vanessa [place], MD     Interpretation: normal     ECG rate:  70-80s   ECG rate assessment: normal     Rhythm: sinus rhythm     Ectopy: none     Conduction: normal     MEDICATIONS ORDERED IN ED: Medications  hydrocortisone sodium succinate (SOLU-CORTEF) injection 200 mg (200 mg Intravenous Not Given 11/16/21 1028)  diphenhydrAMINE (BENADRYL) capsule 50 mg (50 mg Oral Not Given 11/16/21 1029)    Or  diphenhydrAMINE (BENADRYL) injection 50 mg ( Intravenous See Alternative 11/16/21 1029)  lidocaine (LIDODERM) 5 %  1 patch (1 patch Transdermal Patch Applied 11/16/21 0942)  oxyCODONE (Oxy IR/ROXICODONE) immediate release tablet 5 mg (5 mg Oral Given 11/16/21 0827)  acetaminophen (TYLENOL) tablet 1,000 mg (1,000 mg Oral Given 11/16/21 0947)     IMPRESSION / MDM / ASSESSMENT AND PLAN / ED COURSE  I reviewed the triage vital signs and the nursing notes.  Patient with hypertension, hyperlipidemia who comes in with chest pain Differential diagnosis includes, but is not limited to, ACS, PE.  Patient has low Wells score so we will start off with D-dimer does not sound like a dissection.  Labs to evaluate for anemia, AKI, COVID test.  Does not drink alcohol physically seen to be tender in her abdomen to suggest pancreatitis.  No rash noted.  Patient given some Tylenol, oxycodone, lidocaine patch for pain.  Lipase is normal CMP shows kidney function slightly elevated but similar to prior CBC no anemia COVID, flu negative D-dimer negative Trop  negative  Reevaluated patient she is feeling better.  Repeat abdominal exam soft and nontender.  1:10 PM reevaluated patient continuing to feel much better.  Repeat cardiac markers negative  I considered the possibility admission given patient came in with chest pain given her pain is resolving and her troponins are negative x2 with reassuring EKG it seems less likely related to her heart.  We will have her follow-up with cardiology outpatient and return to the ER if she develops any worsening symptoms or any other concerns  The patient is on the cardiac monitor to evaluate for evidence of arrhythmia and/or significant heart rate changes.   FINAL CLINICAL IMPRESSION(S) / ED DIAGNOSES   Final diagnoses:  Chest pain, unspecified type     Rx / DC Orders   ED Discharge Orders          Ordered    lidocaine (LIDODERM) 5 %  Every 12 hours        11/16/21 1311             Note:  This document was prepared using Dragon voice recognition software and may include unintentional dictation errors.   Vanessa [place], MD 11/16/21 [phone]

## 2021-11-16 NOTE — Discharge Instructions (Addendum)
Take Tylenol 1 g every 8 hours and use ibuprofen 600 every 6-8 hours with food.  Use the pain patches.  Return to the ER if develop worsening symptoms or any other concerns

## 2021-11-16 NOTE — ED Triage Notes (Signed)
Pt c/o left sided chest pain over the weekend with some nausea, denies SOB or other sx

## 2021-11-16 NOTE — ED Notes (Signed)
Pt advised she began to have left sided chest pain on Friday evening. The pt advised her chest pain has progressed all weekend. The pt also c/o of some intermittent sob. The pt does have a right sided hand tremor. Pt rated her pain at a 7.

## 2022-11-02 ENCOUNTER — Emergency Department: Payer: Medicaid Other

## 2022-11-02 ENCOUNTER — Emergency Department
Admission: EM | Admit: 2022-11-02 | Discharge: 2022-11-02 | Disposition: A | Discharge status: Home / Self Care | Payer: Medicaid Other | Attending: Student in an Organized Health Care Education/Training Program | Admitting: Student in an Organized Health Care Education/Training Program

## 2022-11-02 DIAGNOSIS — Z1152 Encounter for screening for COVID-19: Secondary | ICD-10-CM | POA: Diagnosis not present

## 2022-11-02 DIAGNOSIS — R059 Cough, unspecified: Secondary | ICD-10-CM | POA: Diagnosis present

## 2022-11-02 DIAGNOSIS — J45901 Unspecified asthma with (acute) exacerbation: Secondary | ICD-10-CM | POA: Insufficient documentation

## 2022-11-02 DIAGNOSIS — J069 Acute upper respiratory infection, unspecified: Secondary | ICD-10-CM | POA: Insufficient documentation

## 2022-11-02 DIAGNOSIS — R051 Acute cough: Secondary | ICD-10-CM

## 2022-11-02 DIAGNOSIS — Z85528 Personal history of other malignant neoplasm of kidney: Secondary | ICD-10-CM | POA: Diagnosis not present

## 2022-11-02 DIAGNOSIS — I10 Essential (primary) hypertension: Secondary | ICD-10-CM | POA: Insufficient documentation

## 2022-11-02 LAB — BASIC METABOLIC PANEL
Anion gap: 9 (ref 5–15)
BUN: 17 mg/dL (ref 6–20)
CO2: 27 mmol/L (ref 22–32)
Calcium: 9.3 mg/dL (ref 8.9–10.3)
Chloride: 102 mmol/L (ref 98–111)
Creatinine, Ser: 1.17 mg/dL — ABNORMAL HIGH (ref 0.44–1.00)
GFR, Estimated: 54 mL/min — ABNORMAL LOW (ref >60)
Glucose, Bld: 165 mg/dL — ABNORMAL HIGH (ref 70–99)
Potassium: 3.6 mmol/L (ref 3.5–5.1)
Sodium: 138 mmol/L (ref 135–145)

## 2022-11-02 LAB — CBC
HCT: 34.9 % — ABNORMAL LOW (ref 36.0–46.0)
Hemoglobin: 11.3 g/dL — ABNORMAL LOW (ref 12.0–15.0)
MCH: 29.4 pg (ref 26.0–34.0)
MCHC: 32.4 g/dL (ref 30.0–36.0)
MCV: 90.9 fL (ref 80.0–100.0)
Platelets: 246 10*3/uL (ref 150–400)
RBC: 3.84 MIL/uL — ABNORMAL LOW (ref 3.87–5.11)
RDW: 13.9 % (ref 11.5–15.5)
WBC: 4.6 10*3/uL (ref 4.0–10.5)
nRBC: 0 % (ref 0.0–0.2)

## 2022-11-02 LAB — RESP PANEL BY RT-PCR (RSV, FLU A&B, COVID)  RVPGX2
Influenza A by PCR: NEGATIVE
Influenza B by PCR: NEGATIVE
Resp Syncytial Virus by PCR: NEGATIVE
SARS Coronavirus 2 by RT PCR: NEGATIVE

## 2022-11-02 MED ORDER — IPRATROPIUM-ALBUTEROL 0.5-2.5 (3) MG/3ML IN SOLN
3.0000 mL | Freq: Once | RESPIRATORY_TRACT | Status: AC
  Administered 2022-11-02: 3 mL via RESPIRATORY_TRACT
  Filled 2022-11-02: qty 3

## 2022-11-02 NOTE — ED Provider Notes (Signed)
[facility] Provider Note    Event Date/Time   First MD Initiated Contact with Patient 11/02/22 1829     (approximate)   History   Shortness of Breath   HPI  Wendy Ramos is a 57 y.o. female who presents today for evaluation of shortness of breath, body aches, and cough that began last night.  She reports that she also feels like she is wheezing.  She called EMS and they told her that her temperature was 18 F and she was given Tylenol which has helped her symptoms.  She denies chest pain, abdominal pain, nausea, vomiting, she reports that she has had diarrhea today.  Her stool is nonbloody.  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  Patient Active Problem List   Diagnosis Date Noted   Renal cancer ([facility]) 06/02/2021   Flashing lights 10/02/2020   Vitreous floaters of right eye 10/02/2020   Vitreous syneresis of both eyes 10/02/2020   Cervical radiculitis 01/25/2020   Foraminal stenosis of lumbar region 01/25/2020   DDD (degenerative disc disease), lumbar 10/03/2019   Myofascial pain 10/03/2019   OSA (obstructive sleep apnea) 09/04/2019   Hypokalemia 03/30/2018   Influenza A 12/04/2017   Acute respiratory failure ([place]) 12/04/2017   Chest pain 12/04/2017   Asthma exacerbation 09/10/2017   Arthritis of knee 03/25/2017   Patellofemoral pain syndrome of both knees 03/25/2017   Syncope 03/06/2017   History of partial nephrectomy 02/03/2017   Hematuria, microscopic 07/15/2016   Long-term current use of opiate analgesic 06/02/2016   Neck pain 06/02/2016   Acute pain of left knee 12/11/2014   Class 3 severe obesity due to excess calories with serious comorbidity and body mass index (BMI) of 45.0 to 49.9 in adult [facility]) 08/15/2014   Hypertension 03/07/2013   Depression 06/21/2011   GERD (gastroesophageal reflux disease) 06/21/2011   Migraine 06/21/2011          Physical Exam   Triage Vital Signs: ED Triage Vitals   Enc Vitals Group     BP 11/02/22 1723 (!) 143/107     Pulse Rate 11/02/22 1723 91     Resp 11/02/22 1723 18     Temp 11/02/22 1723 99.7 F (37.6 C)     Temp Source 11/02/22 1723 Oral     SpO2 11/02/22 1723 97 %     Weight 11/02/22 1724 280 lb (127 kg)     Height --      Head Circumference --      Peak Flow --      Pain Score 11/02/22 1724 8     Pain Loc --      Pain Edu? --      Excl. in [place]? --     Most recent vital signs: Vitals:   11/02/22 1900 11/02/22 1945  BP: 126/84   Pulse: 67   Resp: 18   Temp:  98.9 F (37.2 C)  SpO2: 98%     Physical Exam Vitals and nursing note reviewed.  Constitutional:      General: Awake and alert. No acute distress.    Appearance: Normal appearance. The patient is obese.  HENT:     Head: Normocephalic and atraumatic.     Mouth: Mucous membranes are moist.  Eyes:     General: PERRL. Normal EOMs        Right eye: No discharge.        Left eye: No discharge.  Conjunctiva/sclera: Conjunctivae normal.  Cardiovascular:     Rate and Rhythm: Normal rate and regular rhythm.     Pulses: Normal pulses.     Heart sounds: Normal heart sounds Pulmonary:     Effort: Pulmonary effort is normal. No respiratory distress.     Breath sounds: Normal breath sounds.  Mild expiratory wheezes in left lung base.  Able to speak easily in complete sentences Abdominal:     Abdomen is soft. There is no abdominal tenderness. No rebound or guarding. No distention. Musculoskeletal:        General: No swelling. Normal range of motion.     Cervical back: Normal range of motion and neck supple.  No lower extremity edema Skin:    General: Skin is warm and dry.     Capillary Refill: Capillary refill takes less than 2 seconds.     Findings: No rash.  Neurological:     Mental Status: The patient is awake and alert.      ED Results / Procedures / Treatments   Labs (all labs ordered are listed, but only abnormal results are displayed) Labs Reviewed   BASIC METABOLIC PANEL - Abnormal; Notable for the following components:      Result Value   Glucose, Bld 165 (*)    Creatinine, Ser 1.17 (*)    GFR, Estimated 54 (*)    All other components within normal limits  CBC - Abnormal; Notable for the following components:   RBC 3.84 (*)    Hemoglobin 11.3 (*)    HCT 34.9 (*)    All other components within normal limits  RESP PANEL BY RT-PCR (RSV, FLU A&B, COVID)  RVPGX2  POC URINE PREG, ED     EKG     RADIOLOGY I independently reviewed and interpreted imaging and agree with radiologists findings.     PROCEDURES:  Critical Care performed:   Procedures   MEDICATIONS ORDERED IN ED: Medications  ipratropium-albuterol (DUONEB) 0.5-2.5 (3) MG/3ML nebulizer solution 3 mL (3 mLs Nebulization Given 11/02/22 1854)     IMPRESSION / MDM / ASSESSMENT AND PLAN / ED COURSE  I reviewed the triage vital signs and the nursing notes.   Differential diagnosis includes, but is not limited to, influenza, bronchitis, pneumonia, COVID-19, RSV.  Patient is awake and alert, hemodynamically stable and afebrile.  She has a normal oxygen saturation of 97% on room air and demonstrates no increased work of breathing.  No accessory muscle use.  Labs obtained in triage are overall reassuring.  Chest x-ray is without cardiopulmonary abnormality or focal consolidation.  Swab is negative for COVID/flu/RSV.  Her labs are within normal limits.  No chest pain or fever to suggest myocarditis.  No abdominal pain or tenderness.  She was treated symptomatically with a DuoNeb with significant improvement of her symptoms.  We discussed return precautions and the importance of close outpatient follow-up.  Patient understands and agrees with plan.  She was discharged in stable condition.   Patient's presentation is most consistent with acute complicated illness / injury requiring diagnostic workup.    FINAL CLINICAL IMPRESSION(S) / ED DIAGNOSES   Final diagnoses:   Acute cough  Upper respiratory tract infection, unspecified type     Rx / DC Orders   ED Discharge Orders     None        Note:  This document was prepared using Dragon voice recognition software and may include unintentional dictation errors.   Marquette Old, PA-C 11/02/22 2058  Merlyn Lot, MD 11/02/22 2112

## 2022-11-02 NOTE — ED Triage Notes (Signed)
Pt arrives via EMS. Pt sts that around 2100 yesterday that pt was SOB. Pt used her rescue inhaler and it has not helped. Pt has fever and was given '975mg'$  of tylenol en route to ED by EMS.

## 2022-11-02 NOTE — Discharge Instructions (Signed)
Your chest x-ray does not show pneumonia.  Your COVID/flu/RSV test was negative.  Please continue to use your inhalers as needed, as well as Tylenol 650 mg every 6-8 hours as needed for fever or bodyaches.  Please return to the emergency department immediately for any new, worsening, or change in symptoms or other concerns. It was a pleasure caring for you today.

## 2023-01-04 ENCOUNTER — Encounter: Payer: Self-pay | Admitting: Emergency Medicine

## 2023-01-04 ENCOUNTER — Other Ambulatory Visit: Payer: Self-pay

## 2023-01-04 ENCOUNTER — Emergency Department
Admission: EM | Admit: 2023-01-04 | Discharge: 2023-01-04 | Disposition: A | Discharge status: Home / Self Care | Payer: Medicaid Other | Attending: Emergency Medicine | Admitting: Emergency Medicine

## 2023-01-04 DIAGNOSIS — J45909 Unspecified asthma, uncomplicated: Secondary | ICD-10-CM | POA: Diagnosis not present

## 2023-01-04 DIAGNOSIS — I10 Essential (primary) hypertension: Secondary | ICD-10-CM | POA: Diagnosis not present

## 2023-01-04 DIAGNOSIS — U071 COVID-19: Secondary | ICD-10-CM | POA: Insufficient documentation

## 2023-01-04 DIAGNOSIS — R509 Fever, unspecified: Secondary | ICD-10-CM | POA: Diagnosis present

## 2023-01-04 LAB — RESP PANEL BY RT-PCR (RSV, FLU A&B, COVID)  RVPGX2
Influenza A by PCR: NEGATIVE
Influenza B by PCR: NEGATIVE
Resp Syncytial Virus by PCR: NEGATIVE
SARS Coronavirus 2 by RT PCR: POSITIVE — AB

## 2023-01-04 LAB — URINALYSIS, ROUTINE W REFLEX MICROSCOPIC
Bilirubin Urine: NEGATIVE
Glucose, UA: NEGATIVE mg/dL
Ketones, ur: NEGATIVE mg/dL
Leukocytes,Ua: NEGATIVE
Nitrite: NEGATIVE
Protein, ur: NEGATIVE mg/dL
Specific Gravity, Urine: 1.015 (ref 1.005–1.030)
pH: 5 (ref 5.0–8.0)

## 2023-01-04 MED ORDER — ACETAMINOPHEN 325 MG PO TABS
650.0000 mg | ORAL_TABLET | Freq: Once | ORAL | Status: AC
  Administered 2023-01-04: 650 mg via ORAL
  Filled 2023-01-04: qty 2

## 2023-01-04 NOTE — ED Provider Notes (Signed)
[facility] Provider Note    Event Date/Time   First MD Initiated Contact with Patient 01/04/23 1021     (approximate)   History   Fever, Back Pain, and Headache   HPI  Wendy Ramos is a 57 y.o. female   presents to the ED via EMS with complaint of fever, low back pain, headache, nausea that started today.  EMS reports that she had a temperature of 101 and that her CBG was 138.  Patient is unaware of any known sick contacts and denies any family members being sick at this time.  Patient has a history of asthma, hypertension, GERD, migraines, renal cancer.      Physical Exam   Triage Vital Signs: ED Triage Vitals  Enc Vitals Group     BP 01/04/23 1006 128/85     Pulse Rate 01/04/23 1006 86     Resp 01/04/23 1006 16     Temp 01/04/23 1006 99.2 F (37.3 C)     Temp Source 01/04/23 1006 Oral     SpO2 01/04/23 1006 97 %     Weight 01/04/23 1007 270 lb (122.5 kg)     Height 01/04/23 1007 5\' 4"  (1.626 m)     Head Circumference --      Peak Flow --      Pain Score 01/04/23 1007 10     Pain Loc --      Pain Edu? --      Excl. in [place]? --     Most recent vital signs: Vitals:   01/04/23 1006  BP: 128/85  Pulse: 86  Resp: 16  Temp: 99.2 F (37.3 C)  SpO2: 97%     General: Awake, no distress.  CV:  Good peripheral perfusion.  Heart regular rate and rhythm. Resp:  Normal effort.  Lungs are clear bilaterally. Abd:  No distention.  Soft, obese, nontender, bowel sounds present x 4 quadrants. Other:     ED Results / Procedures / Treatments   Labs (all labs ordered are listed, but only abnormal results are displayed) Labs Reviewed  RESP PANEL BY RT-PCR (RSV, FLU A&B, COVID)  RVPGX2 - Abnormal; Notable for the following components:      Result Value   SARS Coronavirus 2 by RT PCR POSITIVE (*)    All other components within normal limits  URINALYSIS, ROUTINE W REFLEX MICROSCOPIC - Abnormal; Notable for the following components:   Color, Urine  YELLOW (*)    APPearance CLEAR (*)    Hgb urine dipstick LARGE (*)    Bacteria, UA RARE (*)    All other components within normal limits      PROCEDURES:  Critical Care performed:   Procedures   MEDICATIONS ORDERED IN ED: Medications  acetaminophen (TYLENOL) tablet 650 mg (650 mg Oral Given 01/04/23 1044)     IMPRESSION / MDM / ASSESSMENT AND PLAN / ED COURSE  I reviewed the triage vital signs and the nursing notes.   Differential diagnosis includes, but is not limited to, COVID, influenza, RSV, viral illness.  57 year old female presents to the ED via EMS with complaint of fever, body aches, headache and nausea without vomiting.  Patient states that symptoms began this morning.  She was given Tylenol 650 mg p.o. while in the ED and made aware that her respiratory panel is positive for COVID.  We discussed isolation precautions and that she was contagious.  She is instructed to continue drinking fluids to stay hydrated and Tylenol/ibuprofen  as needed for body aches, headache or fever.  She is to follow-up with her PCP if any continued problems or return to the emergency department if any severe worsening of her symptoms.      Patient's presentation is most consistent with acute complicated illness / injury requiring diagnostic workup.  FINAL CLINICAL IMPRESSION(S) / ED DIAGNOSES   Final diagnoses:  COVID     Rx / DC Orders   ED Discharge Orders     None        Note:  This document was prepared using Dragon voice recognition software and may include unintentional dictation errors.   Johnn Hai, PA-C 01/04/23 1309    Vanessa Liberty, MD 01/08/23 [phone]

## 2023-01-04 NOTE — ED Notes (Signed)
Says she has fever and body aches since last night.  Head and back ache.  Was incontinant of urine on way to bed.  She sounds sob at times, but then gets better.  Pulse o\x is 99% on RA at rest.  No cough.

## 2023-01-04 NOTE — ED Triage Notes (Signed)
Pt to ED via [facility] from home for fever, lower back pain, headache, headache, and nausea. Pt also reports that she has a very dry mouth. Pt vital signs stable in triage. Per EMS pt had temp of 101 with them and her CBG was 138.

## 2023-01-04 NOTE — Discharge Instructions (Signed)
Follow with your primary care provider if any continued problems or concerns.  You will need to quarantine for 5 days since you have had the COVID-vaccine.  Tylenol and ibuprofen as needed for body aches, headache or fever.  Drink lots of fluids to stay hydrated.  Return to the emergency department if you develop any worsening of your symptoms such as difficulty breathing or shortness of breath.

## 2023-03-19 ENCOUNTER — Other Ambulatory Visit: Payer: Self-pay

## 2023-03-19 ENCOUNTER — Emergency Department: Payer: Medicaid Other

## 2023-03-19 ENCOUNTER — Emergency Department
Admission: EM | Admit: 2023-03-19 | Discharge: 2023-03-19 | Discharge status: Against Medical Advice | Payer: Medicaid Other | Attending: Emergency Medicine | Admitting: Emergency Medicine

## 2023-03-19 DIAGNOSIS — N644 Mastodynia: Secondary | ICD-10-CM | POA: Insufficient documentation

## 2023-03-19 DIAGNOSIS — Z5321 Procedure and treatment not carried out due to patient leaving prior to being seen by health care provider: Secondary | ICD-10-CM | POA: Diagnosis not present

## 2023-03-19 DIAGNOSIS — R0789 Other chest pain: Secondary | ICD-10-CM | POA: Diagnosis not present

## 2023-03-19 LAB — BASIC METABOLIC PANEL
Anion gap: 9 (ref 5–15)
BUN: 23 mg/dL — ABNORMAL HIGH (ref 6–20)
CO2: 23 mmol/L (ref 22–32)
Calcium: 9.4 mg/dL (ref 8.9–10.3)
Chloride: 106 mmol/L (ref 98–111)
Creatinine, Ser: 1.13 mg/dL — ABNORMAL HIGH (ref 0.44–1.00)
GFR, Estimated: 56 mL/min — ABNORMAL LOW (ref >60)
Glucose, Bld: 97 mg/dL (ref 70–99)
Potassium: 4.1 mmol/L (ref 3.5–5.1)
Sodium: 138 mmol/L (ref 135–145)

## 2023-03-19 LAB — CBC
HCT: 34.6 % — ABNORMAL LOW (ref 36.0–46.0)
Hemoglobin: 11.2 g/dL — ABNORMAL LOW (ref 12.0–15.0)
MCH: 29.7 pg (ref 26.0–34.0)
MCHC: 32.4 g/dL (ref 30.0–36.0)
MCV: 91.8 fL (ref 80.0–100.0)
Platelets: 245 10*3/uL (ref 150–400)
RBC: 3.77 MIL/uL — ABNORMAL LOW (ref 3.87–5.11)
RDW: 14.4 % (ref 11.5–15.5)
WBC: 5.4 10*3/uL (ref 4.0–10.5)
nRBC: 0 % (ref 0.0–0.2)

## 2023-03-19 LAB — TROPONIN I (HIGH SENSITIVITY): Troponin I (High Sensitivity): 5 ng/L (ref <18)

## 2023-03-19 NOTE — ED Triage Notes (Signed)
Pt presents via POV c/o right sided chest/breast pain starting this morning at apx 0830. Denies cardiac hx.

## 2023-03-19 NOTE — ED Notes (Signed)
Pt reports she is leaving ED and will check results on MyChart.

## 2023-07-09 ENCOUNTER — Emergency Department
Admission: EM | Admit: 2023-07-09 | Discharge: 2023-07-09 | Disposition: A | Discharge status: Home / Self Care | Payer: MEDICAID | Attending: Emergency Medicine | Admitting: Emergency Medicine

## 2023-07-09 ENCOUNTER — Other Ambulatory Visit: Payer: Self-pay

## 2023-07-09 DIAGNOSIS — W57XXXA Bitten or stung by nonvenomous insect and other nonvenomous arthropods, initial encounter: Secondary | ICD-10-CM | POA: Diagnosis not present

## 2023-07-09 DIAGNOSIS — S1096XA Insect bite of unspecified part of neck, initial encounter: Secondary | ICD-10-CM | POA: Diagnosis present

## 2023-07-09 DIAGNOSIS — I10 Essential (primary) hypertension: Secondary | ICD-10-CM | POA: Diagnosis not present

## 2023-07-09 DIAGNOSIS — Z20822 Contact with and (suspected) exposure to covid-19: Secondary | ICD-10-CM | POA: Insufficient documentation

## 2023-07-09 DIAGNOSIS — G44209 Tension-type headache, unspecified, not intractable: Secondary | ICD-10-CM | POA: Insufficient documentation

## 2023-07-09 LAB — SARS CORONAVIRUS 2 BY RT PCR: SARS Coronavirus 2 by RT PCR: NEGATIVE

## 2023-07-09 MED ORDER — ONDANSETRON 4 MG PO TBDP
4.0000 mg | ORAL_TABLET | Freq: Once | ORAL | Status: AC
  Administered 2023-07-09: 4 mg via ORAL
  Filled 2023-07-09: qty 1

## 2023-07-09 MED ORDER — EXCEDRIN MIGRAINE 250-250-65 MG PO TABS: 1.0000 | ORAL_TABLET | Freq: Every day | ORAL | 0 refills | Status: AC | PRN

## 2023-07-09 MED ORDER — KETOROLAC TROMETHAMINE 15 MG/ML IJ SOLN
15.0000 mg | Freq: Once | INTRAMUSCULAR | Status: AC
  Administered 2023-07-09: 15 mg via INTRAMUSCULAR
  Filled 2023-07-09: qty 1

## 2023-07-09 MED ORDER — DEXAMETHASONE SODIUM PHOSPHATE 10 MG/ML IJ SOLN
10.0000 mg | Freq: Once | INTRAMUSCULAR | Status: AC
  Administered 2023-07-09: 10 mg via INTRAMUSCULAR
  Filled 2023-07-09: qty 1

## 2023-07-09 MED ORDER — ACETAMINOPHEN 500 MG PO TABS
1000.0000 mg | ORAL_TABLET | Freq: Once | ORAL | Status: AC
  Administered 2023-07-09: 1000 mg via ORAL
  Filled 2023-07-09: qty 2

## 2023-07-09 NOTE — ED Triage Notes (Signed)
Patient is here today with complaints of a headache following an insect bite to the back of her neck Wednesday night; Small bump noted to patient's hair line, does not appear to be grossly infected; Patient states that she has had no relief with Tylenol or Hydrocodone at home

## 2023-07-09 NOTE — ED Provider Notes (Signed)
[facility] Emergency Department Provider Note     Event Date/Time   First MD Initiated Contact with Patient 07/09/23 1634     (approximate)   History   Headache (Patient is here today with complaints of a headache following an insect bite to the back of her neck Wednesday night; Small bump noted to patient's hair line, does not appear to be grossly infected; Patient states that she has had no relief with Tylenol or Hydrocodone at home)   HPI  Wendy Ramos is a 57 y.o. female with a history of HTN and migraines presents to the ED with complaint of intermittent headache x 2 days.  Patient describes headache as pressure and throbbing that starts at the back of her head and radiates around her forehead.  Associated symptoms includes jaw pain that comes and goes.  Patient reports she was possibly bitten by a spider on Wednesday on her neck.  She believes this is causing her headaches.  Patient has tried Tylenol and hydrocodone in which she takes for her back pain with no relief.  Patient has experience headaches in the past and this is similar with the exception of jaw pain.  Endorses sick contacts of niece at home.  Patient denies fever, visual changes and vomiting.     Physical Exam   Triage Vital Signs: ED Triage Vitals [07/09/23 1557]  Encounter Vitals Group     BP (!) 149/92     Systolic BP Percentile      Diastolic BP Percentile      Pulse Rate 64     Resp 19     Temp 98.7 F (37.1 C)     Temp Source Oral     SpO2 98 %     Weight 259 lb (117.5 kg)     Height 5\' 2"  (1.575 m)     Head Circumference      Peak Flow      Pain Score 10     Pain Loc      Pain Education      Exclude from Growth Chart     Most recent vital signs: Vitals:   07/09/23 1557  BP: (!) 149/92  Pulse: 64  Resp: 19  Temp: 98.7 F (37.1 C)  SpO2: 98%    General: Well appearing. Alert and oriented. INAD.  Skin:  Warm, dry and intact. No rashes or lesions noted.      Head:  NCAT.  No scalp or facial tenderness on palpation or percussion. Eyes:  PERRLA. EOMI.  Nose:   Mucosa is moist. No rhinorrhea. Throat: Oropharynx clear. No erythema or exudates. Tonsils not enlarged. Uvula is midline. Neck:   No cervical spine tenderness to palpation. Full ROM without difficulty.  CV:  Good peripheral perfusion. RRR.  RESP:  Normal effort. LCTAB.  NEURO: Cranial nerves II-XII intact. No focal deficits. Sensation and motor function intact. 5/5 muscle strength of UE & LE. Gait is steady.   ED Results / Procedures / Treatments   Labs (all labs ordered are listed, but only abnormal results are displayed) Labs Reviewed  SARS CORONAVIRUS 2 BY RT PCR   No results found.  PROCEDURES:  Critical Care performed: No  Procedures   MEDICATIONS ORDERED IN ED: Medications  ondansetron (ZOFRAN-ODT) disintegrating tablet 4 mg (4 mg Oral Given 07/09/23 1722)  ketorolac (TORADOL) 15 MG/ML injection 15 mg (15 mg Intramuscular Given 07/09/23 1722)  acetaminophen (TYLENOL) tablet 1,000 mg (1,000 mg Oral Given 07/09/23  1722)  dexamethasone (DECADRON) injection 10 mg (10 mg Intramuscular Given 07/09/23 1722)     IMPRESSION / MDM / ASSESSMENT AND PLAN / ED COURSE  I reviewed the triage vital signs and the nursing notes.                               57 y.o. female presents to the emergency department for evaluation and treatment of acute headache and insect bite. See HPI for further details.   Differential diagnosis includes, but is not limited to tension headache, meningitis, COVID, temporal arteritis considered but less likely, insect bite  Patient's presentation is most consistent with acute complicated illness / injury requiring diagnostic workup.  Patient is alert and oriented.  She is hemodynamically stable.  Physical exam findings are overall benign.  No headache red flags.  COVID is reassuring.  Neuroexam is normal. Symptoms have improved with ED management of  migraine cocktail which increases my suspicion for tension headache with associated TMJ pain.  No imaging indicated.  Has an appointment with her primary care on Tuesday in which she is encouraged to discuss this ED visit.  ED precautions discussed. Patient verbalizes understanding. All questions and concerns were addressed during ED visit.     FINAL CLINICAL IMPRESSION(S) / ED DIAGNOSES   Final diagnoses:  Tension headache  Insect bite of neck, initial encounter   Rx / DC Orders   ED Discharge Orders          Ordered    aspirin-acetaminophen-caffeine (EXCEDRIN MIGRAINE) 250-250-65 MG tablet  Daily PRN        07/09/23 1807             Note:  This document was prepared using Dragon voice recognition software and may include unintentional dictation errors.    Romeo Apple, Yoniel Arkwright A, PA-C 07/09/23 Remonia Richter, MD 07/10/23 [phone]

## 2023-07-09 NOTE — Discharge Instructions (Signed)
Your evaluated in the ED for headache and a possible insect bite.  Your symptoms improved with ED pain management.  Take Excedrin once a day as needed until you can follow-up with your primary care on Tuesday for further recommendation.

## 2023-07-27 ENCOUNTER — Emergency Department
Admission: EM | Admit: 2023-07-27 | Discharge: 2023-07-27 | Disposition: A | Discharge status: Home / Self Care | Payer: MEDICAID | Attending: Emergency Medicine | Admitting: Emergency Medicine

## 2023-07-27 ENCOUNTER — Emergency Department: Payer: MEDICAID

## 2023-07-27 ENCOUNTER — Other Ambulatory Visit: Payer: Self-pay

## 2023-07-27 DIAGNOSIS — M79605 Pain in left leg: Secondary | ICD-10-CM | POA: Diagnosis present

## 2023-07-27 DIAGNOSIS — I10 Essential (primary) hypertension: Secondary | ICD-10-CM | POA: Insufficient documentation

## 2023-07-27 DIAGNOSIS — M79662 Pain in left lower leg: Secondary | ICD-10-CM

## 2023-07-27 NOTE — ED Triage Notes (Signed)
Pt present to ED with c/o of L lower leg pain, sent by PCP for DVT rule out, pain started 2 days ago.

## 2023-07-27 NOTE — Discharge Instructions (Signed)
Your ultrasound was negative for blood clots.  Please return for any new, worsening, or change in symptoms or other concerns.  It was a pleasure caring for you today.

## 2023-07-27 NOTE — ED Provider Notes (Signed)
[facility] Provider Note    Event Date/Time   First MD Initiated Contact with Patient 07/27/23 1440     (approximate)   History   Leg Pain   HPI  Wendy Ramos is a 57 y.o. female who presents today for evaluation of left leg pain for the past 2 days.  Patient went to outside clinic and was sent here for ultrasound for evaluation of blood clot.  Patient has never had a blood clot before.  She is able to ambulate.  Patient Active Problem List   Diagnosis Date Noted   Renal cancer ([facility]) 06/02/2021   Flashing lights 10/02/2020   Vitreous floaters of right eye 10/02/2020   Vitreous syneresis of both eyes 10/02/2020   Cervical radiculitis 01/25/2020   Foraminal stenosis of lumbar region 01/25/2020   DDD (degenerative disc disease), lumbar 10/03/2019   Myofascial pain 10/03/2019   OSA (obstructive sleep apnea) 09/04/2019   Hypokalemia 03/30/2018   Influenza A 12/04/2017   Acute respiratory failure ([facility]) 12/04/2017   Chest pain 12/04/2017   Asthma exacerbation 09/10/2017   Arthritis of knee 03/25/2017   Patellofemoral pain syndrome of both knees 03/25/2017   Syncope 03/06/2017   History of partial nephrectomy 02/03/2017   Hematuria, microscopic 07/15/2016   Long-term current use of opiate analgesic 06/02/2016   Neck pain 06/02/2016   Acute pain of left knee 12/11/2014   Class 3 severe obesity due to excess calories with serious comorbidity and body mass index (BMI) of 45.0 to 49.9 in adult [facility]) 08/15/2014   Hypertension 03/07/2013   Depression 06/21/2011   GERD (gastroesophageal reflux disease) 06/21/2011   Migraine 06/21/2011          Physical Exam   Triage Vital Signs: ED Triage Vitals  Encounter Vitals Group     BP --      Systolic BP Percentile --      Diastolic BP Percentile --      Pulse Rate 07/27/23 1224 62     Resp 07/27/23 1224 17     Temp 07/27/23 1224 97.7 F (36.5 C)     Temp Source 07/27/23 1224 Oral     SpO2 07/27/23  1224 100 %     Weight 07/27/23 1225 257 lb 15 oz (117 kg)     Height 07/27/23 1225 5\' 2"  (1.575 m)     Head Circumference --      Peak Flow --      Pain Score 07/27/23 1225 7     Pain Loc --      Pain Education --      Exclude from Growth Chart --     Most recent vital signs: Vitals:   07/27/23 1224 07/27/23 1458  BP:  (!) 142/78  Pulse: 62 (!) 59  Resp: 17 18  Temp: 97.7 F (36.5 C)   SpO2: 100% 98%    Physical Exam Vitals and nursing note reviewed.  Constitutional:      General: Awake and alert. No acute distress.    Appearance: Normal appearance. The patient is obese.  HENT:     Head: Normocephalic and atraumatic.     Mouth: Mucous membranes are moist.  Eyes:     General: PERRL. Normal EOMs        Right eye: No discharge.        Left eye: No discharge.     Conjunctiva/sclera: Conjunctivae normal.  Cardiovascular:     Rate and Rhythm: Normal rate and regular rhythm.  Pulses: Normal pulses.  No JVD Pulmonary:     Effort: Pulmonary effort is normal. No respiratory distress.     Breath sounds: Normal breath sounds.  No crackles Abdominal:     Abdomen is soft. There is no abdominal tenderness. No rebound or guarding. No distention. Musculoskeletal:        General: No swelling. Normal range of motion.     Cervical back: Normal range of motion and neck supple.  Left leg: No obvious swelling, erythema, ecchymosis, skin changes, or other abnormalities noted. Normal pedal pulses. Normal plantarflexion and dorsiflexion against resistance. Compartments soft and compressible throughout. Sensation intact to light touch throughout. Full ROM of hip, knee, and ankle with PROM and AROM. Negative Thompson test Normal appearing right leg Skin:    General: Skin is warm and dry.     Capillary Refill: Capillary refill takes less than 2 seconds.     Findings: No rash.  Neurological:     Mental Status: The patient is awake and alert.      ED Results / Procedures / Treatments    Labs (all labs ordered are listed, but only abnormal results are displayed) Labs Reviewed - No data to display   EKG     RADIOLOGY I independently reviewed and interpreted imaging and agree with radiologists findings.     PROCEDURES:  Critical Care performed:   Procedures   MEDICATIONS ORDERED IN ED: Medications - No data to display   IMPRESSION / MDM / ASSESSMENT AND PLAN / ED COURSE  I reviewed the triage vital signs and the nursing notes.   Differential diagnosis includes, but is not limited to, DVT, calf strain, spasm, achilles tendonitis, Baker's cyst.  I reviewed the patient's chart.  Patient went into walk-in clinic earlier today, though was sent here for ultrasound.  Patient is awake and alert, hemodynamically stable and afebrile.  She has no appreciable swelling or skin changes noted to her left lower extremity.  I had her remove her pants that I could visualize her entire leg, there are no abnormalities noted anywhere, no discoloration.  Her sensation is intact and equal to opposite.  She has normal distal pulses, normal capillary refill, also equal to opposite, do not suspect vascular occlusion.  Lower extremity ultrasound obtained in triage is negative for DVT.  Patient is quite reassured by these results.  Other etiologies include  calf strain, tendinitis, or Baker's cyst.  Recommended rest, cool/warm compresses, elevation.  She is able to ambulate with a steady gait.  There are no signs or symptoms of infection anywhere.  Her compartments are soft and compressible throughout, not consistent with compartment syndrome.  Negative Thompson test, able to plantarflex and dorsiflex against resistance, not consistent with complete Achilles tendon tear.  We discussed return precautions and outpatient follow-up.  Patient or stands and agrees with plan.  She was discharged in stable condition.  Patient's presentation is most consistent with acute complicated illness /  injury requiring diagnostic workup.      FINAL CLINICAL IMPRESSION(S) / ED DIAGNOSES   Final diagnoses:  Pain of left calf     Rx / DC Orders   ED Discharge Orders     None        Note:  This document was prepared using Dragon voice recognition software and may include unintentional dictation errors.   Jackelyn Hoehn, PA-C 07/27/23 1651    Janith Lima, MD 07/30/23 [phone]
